# Patient Record
Sex: Female | Born: 1985 | Race: White | Hispanic: No | Marital: Married | Smoking: Current every day smoker
Health system: Southern US, Community
[De-identification: ages and names within clinical notes are randomized; demographics above are authoritative.]

## PROBLEM LIST (undated history)

## (undated) DIAGNOSIS — F329 Major depressive disorder, single episode, unspecified: Secondary | ICD-10-CM

## (undated) DIAGNOSIS — R569 Unspecified convulsions: Secondary | ICD-10-CM

## (undated) DIAGNOSIS — F32A Depression, unspecified: Secondary | ICD-10-CM

## (undated) DIAGNOSIS — F419 Anxiety disorder, unspecified: Secondary | ICD-10-CM

## (undated) DIAGNOSIS — F1910 Other psychoactive substance abuse, uncomplicated: Secondary | ICD-10-CM

## (undated) HISTORY — DX: Other psychoactive substance abuse, uncomplicated: F19.10

---

## 2015-01-14 ENCOUNTER — Emergency Department (HOSPITAL_COMMUNITY)
Admission: EM | Admit: 2015-01-14 | Discharge: 2015-01-14 | Disposition: A | Discharge status: Home / Self Care | Payer: 59 | Attending: Emergency Medicine | Admitting: Emergency Medicine

## 2015-01-14 ENCOUNTER — Emergency Department (HOSPITAL_COMMUNITY): Payer: 59

## 2015-01-14 ENCOUNTER — Encounter (HOSPITAL_COMMUNITY): Payer: Self-pay | Admitting: Emergency Medicine

## 2015-01-14 DIAGNOSIS — S00431A Contusion of right ear, initial encounter: Secondary | ICD-10-CM | POA: Diagnosis not present

## 2015-01-14 DIAGNOSIS — S0990XA Unspecified injury of head, initial encounter: Secondary | ICD-10-CM | POA: Diagnosis present

## 2015-01-14 DIAGNOSIS — S060X0A Concussion without loss of consciousness, initial encounter: Secondary | ICD-10-CM | POA: Insufficient documentation

## 2015-01-14 DIAGNOSIS — Z8669 Personal history of other diseases of the nervous system and sense organs: Secondary | ICD-10-CM | POA: Insufficient documentation

## 2015-01-14 DIAGNOSIS — Z3202 Encounter for pregnancy test, result negative: Secondary | ICD-10-CM | POA: Diagnosis not present

## 2015-01-14 DIAGNOSIS — Y999 Unspecified external cause status: Secondary | ICD-10-CM | POA: Diagnosis not present

## 2015-01-14 DIAGNOSIS — W19XXXA Unspecified fall, initial encounter: Secondary | ICD-10-CM | POA: Insufficient documentation

## 2015-01-14 DIAGNOSIS — Z720 Tobacco use: Secondary | ICD-10-CM | POA: Diagnosis not present

## 2015-01-14 DIAGNOSIS — Y929 Unspecified place or not applicable: Secondary | ICD-10-CM | POA: Insufficient documentation

## 2015-01-14 DIAGNOSIS — Y9389 Activity, other specified: Secondary | ICD-10-CM | POA: Insufficient documentation

## 2015-01-14 LAB — URINALYSIS, ROUTINE W REFLEX MICROSCOPIC
BILIRUBIN URINE: NEGATIVE
Glucose, UA: NEGATIVE mg/dL
Ketones, ur: 15 mg/dL — AB
Leukocytes, UA: NEGATIVE
Nitrite: NEGATIVE
PROTEIN: NEGATIVE mg/dL
Specific Gravity, Urine: 1.025 (ref 1.005–1.030)
UROBILINOGEN UA: 0.2 mg/dL (ref 0.0–1.0)
pH: 5 (ref 5.0–8.0)

## 2015-01-14 LAB — URINE MICROSCOPIC-ADD ON

## 2015-01-14 LAB — PREGNANCY, URINE: PREG TEST UR: NEGATIVE

## 2015-01-14 MED ORDER — KETOROLAC TROMETHAMINE 60 MG/2ML IM SOLN
60.0000 mg | Freq: Once | INTRAMUSCULAR | Status: AC
  Administered 2015-01-14: 60 mg via INTRAMUSCULAR
  Filled 2015-01-14: qty 2

## 2015-01-14 NOTE — ED Provider Notes (Signed)
CSN: [id number]     Arrival date & time 01/14/15  0820 History   First MD Initiated Contact with Patient 01/14/15 0827     Chief Complaint  Patient presents with  . Fall  . Loss of Consciousness  . Tinnitus     (Consider location/radiation/quality/duration/timing/severity/associated sxs/prior Treatment) HPI   Tara Hensley is a 29 y.o. female who presents for injury from fall, 2 days ago. She does not recall the fall, she had been playing with her dogs. Her next memory is being in the bathroom. Since then she's been using ibuprofen without relief of the pain. She has headache, dizziness, decreased hearing, right ear, and photophobia. She has chronic photophobia from "glaucoma". No prior head injuries. There are no other no modifying factors.   History reviewed. No pertinent past medical history. History reviewed. No pertinent past surgical history. History reviewed. No pertinent family history. Social History  Substance Use Topics  . Smoking status: Current Every Day Smoker -- 25.00 packs/day    Types: Cigarettes  . Smokeless tobacco: None  . Alcohol Use: Yes     Comment: occasionally   OB History    No data available     Review of Systems  All other systems reviewed and are negative.     Allergies  Claritin  Home Medications   Prior to Admission medications   Medication Sig Start Date End Date Taking? Authorizing Provider  ibuprofen (ADVIL,MOTRIN) 400 MG tablet Take 400 mg by mouth every 6 (six) hours as needed for mild pain.   Yes Historical Provider, MD   BP 124/81 mmHg  Pulse 71  Temp(Src) 98.4 F (36.9 C) (Oral)  Resp 16  SpO2 98%  LMP 01/12/2015 (Exact Date) Physical Exam  Constitutional: She is oriented to person, place, and time. She appears well-developed and well-nourished. She appears distressed (She is uncomfortable.).  HENT:  Head: Normocephalic and atraumatic.  Right Ear: External ear normal.  Left Ear: External ear normal.  Decreased  hearing, right-sided. No blood in right auditory canal, normal TM, right. Bruising posterior to right ear, without crepitation or deformity.  Eyes: Conjunctivae and EOM are normal. Pupils are equal, round, and reactive to light.  Neck: Normal range of motion and phonation normal. Neck supple.  Cardiovascular: Normal rate, regular rhythm and normal heart sounds.   Pulmonary/Chest: Effort normal and breath sounds normal. She exhibits no bony tenderness.  Abdominal: Soft. There is no tenderness.  Musculoskeletal: Normal range of motion.  No tenderness or altered motion of cervical, thoracic, lumbar spine.  Neurological: She is alert and oriented to person, place, and time. No cranial nerve deficit or sensory deficit. She exhibits normal muscle tone. Coordination normal.  Skin: Skin is warm, dry and intact.  Psychiatric: She has a normal mood and affect. Her behavior is normal. Judgment and thought content normal.  Nursing note and vitals reviewed.   ED Course  Procedures (including critical care time)  Medications  ketorolac (TORADOL) injection 60 mg (60 mg Intramuscular Given 01/14/15 1032)    Patient Vitals for the past 24 hrs:  BP Temp Temp src Pulse Resp SpO2  01/14/15 1145 124/81 mmHg - - 71 16 98 %  01/14/15 1130 126/81 mmHg - - 76 20 99 %  01/14/15 1115 120/69 mmHg - - 76 20 98 %  01/14/15 1101 121/79 mmHg 98.4 F (36.9 C) Oral 71 19 98 %  01/14/15 1100 121/79 mmHg - - 73 15 99 %  01/14/15 0915 121/82 mmHg - - 68  13 98 %  01/14/15 0900 118/81 mmHg - - 82 14 96 %  01/14/15 0845 117/84 mmHg - - 76 14 97 %  01/14/15 0840 122/77 mmHg 98.2 F (36.8 C) Oral 75 14 97 %    At D/C Reevaluation with update and discussion. After initial assessment and treatment, an updated evaluation reveals no change in clinical status. Findings discussed with patient and partner. All questions answered. Yavier Snider L    Labs Review Labs Reviewed  URINALYSIS, ROUTINE W REFLEX MICROSCOPIC (NOT AT  [facility]) - Abnormal; Notable for the following:    APPearance CLOUDY (*)    Hgb urine dipstick SMALL (*)    Ketones, ur 15 (*)    All other components within normal limits  URINE MICROSCOPIC-ADD ON - Abnormal; Notable for the following:    Squamous Epithelial / LPF MANY (*)    Bacteria, UA FEW (*)    All other components within normal limits  PREGNANCY, URINE    Imaging Review Ct Head Wo Contrast  01/14/2015  CLINICAL DATA:  Pain following fall.  Dizziness. EXAM: CT HEAD WITHOUT CONTRAST TECHNIQUE: Contiguous axial images were obtained from the base of the skull through the vertex without intravenous contrast. COMPARISON:  None. FINDINGS: The ventricles are normal in size and configuration. There is no intracranial mass, hemorrhage, extra-axial fluid collection, or midline shift. The gray-white compartments are normal. No acute infarct evident. The bony calvarium appears intact. The mastoid air cells are clear. There is leftward deviation the nasal septum. IMPRESSION: Incidental note is made of leftward deviation of the nasal septum. Study otherwise unremarkable. No intracranial mass, hemorrhage, or extra-axial fluid collection. Gray-white compartments appear normal. Electronically Signed   By: Bretta Bang III M.D.   On: 01/14/2015 10:00   I have personally reviewed and evaluated these images and lab results as part of my medical decision-making.   EKG Interpretation None      MDM   Final diagnoses:  Concussion, without loss of consciousness, initial encounter  Head injury, initial encounter    Syncope and fall without injury. Likely vaso-vagal. Doubt SBI, metabolic instability or impending vasclar collapse.  Nursing Notes Reviewed/ Care Coordinated Applicable Imaging Reviewed Interpretation of Laboratory Data incorporated into ED treatment  The patient appears reasonably screened and/or stabilized for discharge and I doubt any other medical condition or other [facility] requiring  further screening, evaluation, or treatment in the ED at this time prior to discharge.  Plan: Home Medications- usual; Home Treatments- rest; return here if the recommended treatment, does not improve the symptoms; Recommended follow up- PCP prn    Mancel Bale, MD 01/14/15 1916

## 2015-01-14 NOTE — ED Notes (Signed)
Pt to ER via POV by husband, pt experienced unwitnessed fall Wednesday night. Pt does not remember fall but presents with bruising behind right ear, complaining of ringing in ears and difficulty hearing as well as dizziness and blurred vision. Pt is a/o x4. Speaking in clear, full sentences. No blood thinner use. Denies any medical hx. VSS.

## 2015-01-14 NOTE — Discharge Instructions (Signed)
Concussion, Adult A concussion, or closed-head injury, is a brain injury caused by a direct blow to the head or by a quick and sudden movement (jolt) of the head or neck. Concussions are usually not life-threatening. Even so, the effects of a concussion can be serious. If you have had a concussion before, you are more likely to experience concussion-like symptoms after a direct blow to the head.  CAUSES  Direct blow to the head, such as from running into another player during a soccer game, being hit in a fight, or hitting your head on a hard surface.  A jolt of the head or neck that causes the brain to move back and forth inside the skull, such as in a car crash. SIGNS AND SYMPTOMS The signs of a concussion can be hard to notice. Early on, they may be missed by you, family members, and health care providers. You may look fine but act or feel differently. Symptoms are usually temporary, but they may last for days, weeks, or even longer. Some symptoms may appear right away while others may not show up for hours or days. Every head injury is different. Symptoms include:  Mild to moderate headaches that will not go away.  A feeling of pressure inside your head.  Having more trouble than usual:  Learning or remembering things you have heard.  Answering questions.  Paying attention or concentrating.  Organizing daily tasks.  Making decisions and solving problems.  Slowness in thinking, acting or reacting, speaking, or reading.  Getting lost or being easily confused.  Feeling tired all the time or lacking energy (fatigued).  Feeling drowsy.  Sleep disturbances.  Sleeping more than usual.  Sleeping less than usual.  Trouble falling asleep.  Trouble sleeping (insomnia).  Loss of balance or feeling lightheaded or dizzy.  Nausea or vomiting.  Numbness or tingling.  Increased sensitivity to:  Sounds.  Lights.  Distractions.  Vision problems or eyes that tire  easily.  Diminished sense of taste or smell.  Ringing in the ears.  Mood changes such as feeling sad or anxious.  Becoming easily irritated or angry for little or no reason.  Lack of motivation.  Seeing or hearing things other people do not see or hear (hallucinations). DIAGNOSIS Your health care provider can usually diagnose a concussion based on a description of your injury and symptoms. He or she will ask whether you passed out (lost consciousness) and whether you are having trouble remembering events that happened right before and during your injury. Your evaluation might include:  A brain scan to look for signs of injury to the brain. Even if the test shows no injury, you may still have a concussion.  Blood tests to be sure other problems are not present. TREATMENT  Concussions are usually treated in an emergency department, in urgent care, or at a clinic. You may need to stay in the hospital overnight for further treatment.  Tell your health care provider if you are taking any medicines, including prescription medicines, over-the-counter medicines, and natural remedies. Some medicines, such as blood thinners (anticoagulants) and aspirin, may increase the chance of complications. Also tell your health care provider whether you have had alcohol or are taking illegal drugs. This information may affect treatment.  Your health care provider will send you home with important instructions to follow.  How fast you will recover from a concussion depends on many factors. These factors include how severe your concussion is, what part of your brain was injured,  your age, and how healthy you were before the concussion. °· Most people with mild injuries recover fully. Recovery can take time. In general, recovery is slower in older persons. Also, persons who have had a concussion in the past or have other medical problems may find that it takes longer to recover from their current injury. °HOME  CARE INSTRUCTIONS °General Instructions °· Carefully follow the directions your health care provider gave you. °· Only take over-the-counter or prescription medicines for pain, discomfort, or fever as directed by your health care provider. °· Take only those medicines that your health care provider has approved. °· Do not drink alcohol until your health care provider says you are well enough to do so. Alcohol and certain other drugs may slow your recovery and can put you at risk of further injury. °· If it is harder than usual to remember things, write them down. °· If you are easily distracted, try to do one thing at a time. For example, do not try to watch TV while fixing dinner. °· Talk with family members or close friends when making important decisions. °· Keep all follow-up appointments. Repeated evaluation of your symptoms is recommended for your recovery. °· Watch your symptoms and tell others to do the same. Complications sometimes occur after a concussion. Older adults with a brain injury may have a higher risk of serious complications, such as a blood clot on the brain. °· Tell your teachers, school nurse, school counselor, coach, athletic trainer, or work manager about your injury, symptoms, and restrictions. Tell them about what you can or cannot do. They should watch for: °¨ Increased problems with attention or concentration. °¨ Increased difficulty remembering or learning new information. °¨ Increased time needed to complete tasks or assignments. °¨ Increased irritability or decreased ability to cope with stress. °¨ Increased symptoms. °· Rest. Rest helps the brain to heal. Make sure you: °¨ Get plenty of sleep at night. Avoid staying up late at night. °¨ Keep the same bedtime hours on weekends and weekdays. °¨ Rest during the day. Take daytime naps or rest breaks when you feel tired. °· Limit activities that require a lot of thought or concentration. These include: °¨ Doing homework or job-related  work. °¨ Watching TV. °¨ Working on the computer. °· Avoid any situation where there is potential for another head injury (football, hockey, soccer, basketball, martial arts, downhill snow sports and horseback riding). Your condition will get worse every time you experience a concussion. You should avoid these activities until you are evaluated by the appropriate follow-up health care providers. °Returning To Your Regular Activities °You will need to return to your normal activities slowly, not all at once. You must give your body and brain enough time for recovery. °· Do not return to sports or other athletic activities until your health care provider tells you it is safe to do so. °· Ask your health care provider when you can drive, ride a bicycle, or operate heavy machinery. Your ability to react may be slower after a brain injury. Never do these activities if you are dizzy. °· Ask your health care provider about when you can return to work or school. °Preventing Another Concussion °It is very important to avoid another brain injury, especially before you have recovered. In rare cases, another injury can lead to permanent brain damage, brain swelling, or death. The risk of this is greatest during the first 7-10 days after a head injury. Avoid injuries by: °· Wearing a   your health care provider when you can drive, ride a bicycle, or operate heavy machinery. Your ability to react may be slower after a brain injury. Never do these activities if you are dizzy.   Ask your health care provider about when you can return to work or school.  Preventing Another Concussion  It is very important to avoid another brain injury, especially before you have recovered. In rare cases, another injury can lead to permanent brain damage, brain swelling, or death. The risk of this is greatest during the first 7-10 days after a head injury. Avoid injuries by:   Wearing a seat belt when riding in a car.   Drinking alcohol only in moderation.   Wearing a helmet when biking, skiing, skateboarding, skating, or doing similar activities.   Avoiding activities that could lead to a second concussion, such as contact or recreational sports, until your health care provider says it is okay.   Taking safety measures in your home.    Remove clutter and tripping hazards from floors and stairways.    Use grab bars in bathrooms and handrails by stairs.    Place non-slip mats on floors and in bathtubs.    Improve lighting in dim areas.  SEEK MEDICAL CARE IF:   You have increased problems paying attention or  concentrating.   You have increased difficulty remembering or learning new information.   You need more time to complete tasks or assignments than before.   You have increased irritability or decreased ability to cope with stress.   You have more symptoms than before.  Seek medical care if you have any of the following symptoms for more than 2 weeks after your injury:   Lasting (chronic) headaches.   Dizziness or balance problems.   Nausea.   Vision problems.   Increased sensitivity to noise or light.   Depression or mood swings.   Anxiety or irritability.   Memory problems.   Difficulty concentrating or paying attention.   Sleep problems.   Feeling tired all the time.  SEEK IMMEDIATE MEDICAL CARE IF:   You have severe or worsening headaches. These may be a sign of a blood clot in the brain.   You have weakness (even if only in one hand, leg, or part of the face).   You have numbness.   You have decreased coordination.   You vomit repeatedly.   You have increased sleepiness.   One pupil is larger than the other.   You have convulsions.   You have slurred speech.   You have increased confusion. This may be a sign of a blood clot in the brain.   You have increased restlessness, agitation, or irritability.   You are unable to recognize people or places.   You have neck pain.   It is difficult to wake you up.   You have unusual behavior changes.   You lose consciousness.  MAKE SURE YOU:   Understand these instructions.   Will watch your condition.   Will get help right away if you are not doing well or get worse.     This information is not intended to replace advice given to you by your health care provider. Make sure you discuss any questions you have with your health care provider.     Document Released: 05/12/2003 Document Revised: 03/12/2014 Document Reviewed: 09/11/2012  Elsevier Interactive Patient Education 2016 [employer]  Head Injury, Adult  You have received a head  injury. It does not   in the brain that collects, clots, and forms a bump (hematoma). WHAT CAUSES A HEAD INJURY? A serious head injury is most likely to happen to someone who is in a car wreck and is not wearing a seat belt. Other causes of major head injuries include bicycle or motorcycle accidents, sports injuries, and falls. HOW ARE HEAD INJURIES DIAGNOSED? A complete history of the event leading to the injury and your current symptoms will be helpful in diagnosing head injuries. Many times, pictures of the brain, such as CT or MRI are needed to see the extent of the injury. Often, an overnight hospital stay is necessary for observation.  WHEN SHOULD I SEEK IMMEDIATE MEDICAL CARE?  You should get help right away if:  You have confusion or drowsiness.  You feel sick to your stomach (nauseous) or have continued, forceful vomiting.  You have dizziness or unsteadiness that is getting worse.  You have severe, continued headaches not relieved by medicine. Only take over-the-counter or  prescription medicines for pain, fever, or discomfort as directed by your health care provider.  You do not have normal function of the arms or legs or are unable to walk.  You notice changes in the black spots in the center of the colored part of your eye (pupil).  You have a clear or bloody fluid coming from your nose or ears.  You have a loss of vision. During the next 24 hours after the injury, you must stay with someone who can watch you for the warning signs. This person should contact local emergency services (911 in the [place] if you have seizures, you become unconscious, or you are unable to wake up. HOW CAN I PREVENT A HEAD INJURY IN THE FUTURE? The most important factor for preventing major head injuries is avoiding motor vehicle accidents. To minimize the potential for damage to your head, it is crucial to wear seat belts while riding in motor vehicles. Wearing helmets while bike riding and playing collision sports (like football) is also helpful. Also, avoiding dangerous activities around the house will further help reduce your risk of head injury.  WHEN CAN I RETURN TO NORMAL ACTIVITIES AND ATHLETICS? You should be reevaluated by your health care provider before returning to these activities. If you have any of the following symptoms, you should not return to activities or contact sports until 1 week after the symptoms have stopped:  Persistent headache.  Dizziness or vertigo.  Poor attention and concentration.  Confusion.  Memory problems.  Nausea or vomiting.  Fatigue or tire easily.  Irritability.  Intolerant of bright lights or loud noises.  Anxiety or depression.  Disturbed sleep. MAKE SURE YOU:   Understand these instructions.  Will watch your condition.  Will get help right away if you are not doing well or get worse.   This information is not intended to replace advice given to you by your health care provider. Make sure you discuss any questions you  have with your health care provider.   Document Released: 02/19/2005 Document Revised: 03/12/2014 Document Reviewed: 10/27/2012 Elsevier Interactive Patient Education 2016 [employer].  Concussion, Adult A concussion, or closed-head injury, is a brain injury caused by a direct blow to the head or by a quick and sudden movement (jolt) of the head or neck. Concussions are usually not life-threatening. Even so, the effects of a concussion can be serious. If you have had a concussion before, you are more likely to experience concussion-like symptoms after a direct blow to  the head.  CAUSES  Direct blow to the head, such as from running into another player during a soccer game, being hit in a fight, or hitting your head on a hard surface.  A jolt of the head or neck that causes the brain to move back and forth inside the skull, such as in a car crash. SIGNS AND SYMPTOMS The signs of a concussion can be hard to notice. Early on, they may be missed by you, family members, and health care providers. You may look fine but act or feel differently. Symptoms are usually temporary, but they may last for days, weeks, or even longer. Some symptoms may appear right away while others may not show up for hours or days. Every head injury is different. Symptoms include:  Mild to moderate headaches that will not go away.  A feeling of pressure inside your head.  Having more trouble than usual:  Learning or remembering things you have heard.  Answering questions.  Paying attention or concentrating.  Organizing daily tasks.  Making decisions and solving problems.  Slowness in thinking, acting or reacting, speaking, or reading.  Getting lost or being easily confused.  Feeling tired all the time or lacking energy (fatigued).  Feeling drowsy.  Sleep disturbances.  Sleeping more than usual.  Sleeping less than usual.  Trouble falling asleep.  Trouble sleeping (insomnia).  Loss of balance or  feeling lightheaded or dizzy.  Nausea or vomiting.  Numbness or tingling.  Increased sensitivity to:  Sounds.  Lights.  Distractions.  Vision problems or eyes that tire easily.  Diminished sense of taste or smell.  Ringing in the ears.  Mood changes such as feeling sad or anxious.  Becoming easily irritated or angry for little or no reason.  Lack of motivation.  Seeing or hearing things other people do not see or hear (hallucinations). DIAGNOSIS Your health care provider can usually diagnose a concussion based on a description of your injury and symptoms. He or she will ask whether you passed out (lost consciousness) and whether you are having trouble remembering events that happened right before and during your injury. Your evaluation might include:  A brain scan to look for signs of injury to the brain. Even if the test shows no injury, you may still have a concussion.  Blood tests to be sure other problems are not present. TREATMENT  Concussions are usually treated in an emergency department, in urgent care, or at a clinic. You may need to stay in the hospital overnight for further treatment.  Tell your health care provider if you are taking any medicines, including prescription medicines, over-the-counter medicines, and natural remedies. Some medicines, such as blood thinners (anticoagulants) and aspirin, may increase the chance of complications. Also tell your health care provider whether you have had alcohol or are taking illegal drugs. This information may affect treatment.  Your health care provider will send you home with important instructions to follow.  How fast you will recover from a concussion depends on many factors. These factors include how severe your concussion is, what part of your brain was injured, your age, and how healthy you were before the concussion.  Most people with mild injuries recover fully. Recovery can take time. In general, recovery is  slower in older persons. Also, persons who have had a concussion in the past or have other medical problems may find that it takes longer to recover from their current injury. HOME CARE INSTRUCTIONS General Instructions  Carefully follow the  directions your health care provider gave you.  Only take over-the-counter or prescription medicines for pain, discomfort, or fever as directed by your health care provider.  Take only those medicines that your health care provider has approved.  Do not drink alcohol until your health care provider says you are well enough to do so. Alcohol and certain other drugs may slow your recovery and can put you at risk of further injury.  If it is harder than usual to remember things, write them down.  If you are easily distracted, try to do one thing at a time. For example, do not try to watch TV while fixing dinner.  Talk with family members or close friends when making important decisions.  Keep all follow-up appointments. Repeated evaluation of your symptoms is recommended for your recovery.  Watch your symptoms and tell others to do the same. Complications sometimes occur after a concussion. Older adults with a brain injury may have a higher risk of serious complications, such as a blood clot on the brain.  Tell your teachers, school nurse, school counselor, coach, athletic trainer, or work Production designer, theatre/television/film about your injury, symptoms, and restrictions. Tell them about what you can or cannot do. They should watch for:  Increased problems with attention or concentration.  Increased difficulty remembering or learning new information.  Increased time needed to complete tasks or assignments.  Increased irritability or decreased ability to cope with stress.  Increased symptoms.  Rest. Rest helps the brain to heal. Make sure you:  Get plenty of sleep at night. Avoid staying up late at night.  Keep the same bedtime hours on weekends and weekdays.  Rest during  the day. Take daytime naps or rest breaks when you feel tired.  Limit activities that require a lot of thought or concentration. These include:  Doing homework or job-related work.  Watching TV.  Working on the computer.  Avoid any situation where there is potential for another head injury (football, hockey, soccer, basketball, martial arts, downhill snow sports and horseback riding). Your condition will get worse every time you experience a concussion. You should avoid these activities until you are evaluated by the appropriate follow-up health care providers. Returning To Your Regular Activities You will need to return to your normal activities slowly, not all at once. You must give your body and brain enough time for recovery.  Do not return to sports or other athletic activities until your health care provider tells you it is safe to do so.  Ask your health care provider when you can drive, ride a bicycle, or operate heavy machinery. Your ability to react may be slower after a brain injury. Never do these activities if you are dizzy.  Ask your health care provider about when you can return to work or school. Preventing Another Concussion It is very important to avoid another brain injury, especially before you have recovered. In rare cases, another injury can lead to permanent brain damage, brain swelling, or death. The risk of this is greatest during the first 7-10 days after a head injury. Avoid injuries by:  Wearing a seat belt when riding in a car.  Drinking alcohol only in moderation.  Wearing a helmet when biking, skiing, skateboarding, skating, or doing similar activities.  Avoiding activities that could lead to a second concussion, such as contact or recreational sports, until your health care provider says it is okay.  Taking safety measures in your home.  Remove clutter and tripping hazards from floors and  stairways.  Use grab bars in bathrooms and handrails by  stairs.  Place non-slip mats on floors and in bathtubs.  Improve lighting in dim areas. SEEK MEDICAL CARE IF:  You have increased problems paying attention or concentrating.  You have increased difficulty remembering or learning new information.  You need more time to complete tasks or assignments than before.  You have increased irritability or decreased ability to cope with stress.  You have more symptoms than before. Seek medical care if you have any of the following symptoms for more than 2 weeks after your injury:  Lasting (chronic) headaches.  Dizziness or balance problems.  Nausea.  Vision problems.  Increased sensitivity to noise or light.  Depression or mood swings.  Anxiety or irritability.  Memory problems.  Difficulty concentrating or paying attention.  Sleep problems.  Feeling tired all the time. SEEK IMMEDIATE MEDICAL CARE IF:  You have severe or worsening headaches. These may be a sign of a blood clot in the brain.  You have weakness (even if only in one hand, leg, or part of the face).  You have numbness.  You have decreased coordination.  You vomit repeatedly.  You have increased sleepiness.  One pupil is larger than the other.  You have convulsions.  You have slurred speech.  You have increased confusion. This may be a sign of a blood clot in the brain.  You have increased restlessness, agitation, or irritability.  You are unable to recognize people or places.  You have neck pain.  It is difficult to wake you up.  You have unusual behavior changes.  You lose consciousness. MAKE SURE YOU:  Understand these instructions.  Will watch your condition.  Will get help right away if you are not doing well or get worse.   This information is not intended to replace advice given to you by your health care provider. Make sure you discuss any questions you have with your health care provider.   Document Released: 05/12/2003  Document Revised: 03/12/2014 Document Reviewed: 09/11/2012 Elsevier Interactive Patient Education 2016 [employer]  Head Injury, Adult You have received a head injury. It does not appear serious at this time. Headaches and vomiting are common following head injury. It should be easy to awaken from sleeping. Sometimes it is necessary for you to stay in the emergency department for a while for observation. Sometimes admission to the hospital may be needed. After injuries such as yours, most problems occur within the first 24 hours, but side effects may occur up to 7-10 days after the injury. It is important for you to carefully monitor your condition and contact your health care provider or seek immediate medical care if there is a change in your condition. WHAT ARE THE TYPES OF HEAD INJURIES? Head injuries can be as minor as a bump. Some head injuries can be more severe. More severe head injuries include:  A jarring injury to the brain (concussion).  A bruise of the brain (contusion). This mean there is bleeding in the brain that can cause swelling.  A cracked skull (skull fracture).  Bleeding in the brain that collects, clots, and forms a bump (hematoma). WHAT CAUSES A HEAD INJURY? A serious head injury is most likely to happen to someone who is in a car wreck and is not wearing a seat belt. Other causes of major head injuries include bicycle or motorcycle accidents, sports injuries, and falls. HOW ARE HEAD INJURIES DIAGNOSED? A complete history of the event leading  to the injury and your current symptoms will be helpful in diagnosing head injuries. Many times, pictures of the brain, such as CT or MRI are needed to see the extent of the injury. Often, an overnight hospital stay is necessary for observation.  WHEN SHOULD I SEEK IMMEDIATE MEDICAL CARE?  You should get help right away if:  You have confusion or drowsiness.  You feel sick to your stomach (nauseous) or have continued, forceful  vomiting.  You have dizziness or unsteadiness that is getting worse.  You have severe, continued headaches not relieved by medicine. Only take over-the-counter or prescription medicines for pain, fever, or discomfort as directed by your health care provider.  You do not have normal function of the arms or legs or are unable to walk.  You notice changes in the black spots in the center of the colored part of your eye (pupil).  You have a clear or bloody fluid coming from your nose or ears.  You have a loss of vision. During the next 24 hours after the injury, you must stay with someone who can watch you for the warning signs. This person should contact local emergency services (911 in the [place] if you have seizures, you become unconscious, or you are unable to wake up. HOW CAN I PREVENT A HEAD INJURY IN THE FUTURE? The most important factor for preventing major head injuries is avoiding motor vehicle accidents. To minimize the potential for damage to your head, it is crucial to wear seat belts while riding in motor vehicles. Wearing helmets while bike riding and playing collision sports (like football) is also helpful. Also, avoiding dangerous activities around the house will further help reduce your risk of head injury.  WHEN CAN I RETURN TO NORMAL ACTIVITIES AND ATHLETICS? You should be reevaluated by your health care provider before returning to these activities. If you have any of the following symptoms, you should not return to activities or contact sports until 1 week after the symptoms have stopped:  Persistent headache.  Dizziness or vertigo.  Poor attention and concentration.  Confusion.  Memory problems.  Nausea or vomiting.  Fatigue or tire easily.  Irritability.  Intolerant of bright lights or loud noises.  Anxiety or depression.  Disturbed sleep. MAKE SURE YOU:   Understand these instructions.  Will watch your condition.  Will get help right away if you are  not doing well or get worse.   This information is not intended to replace advice given to you by your health care provider. Make sure you discuss any questions you have with your health care provider.   Document Released: 02/19/2005 Document Revised: 03/12/2014 Document Reviewed: 10/27/2012 Elsevier Interactive Patient Education [employer]

## 2015-01-14 NOTE — ED Notes (Signed)
Pt verbalizes understanding of discharge instructions and follow up information. NAD on departure. VSS. A/O x4.

## 2015-04-25 ENCOUNTER — Inpatient Hospital Stay
Admission: EM | Admit: 2015-04-25 | Discharge: 2015-04-26 | DRG: 101 | Disposition: A | Discharge status: Home / Self Care | Payer: 59 | Attending: Internal Medicine | Admitting: Internal Medicine

## 2015-04-25 ENCOUNTER — Inpatient Hospital Stay: Payer: 59

## 2015-04-25 ENCOUNTER — Encounter: Payer: Self-pay | Admitting: Medical Oncology

## 2015-04-25 DIAGNOSIS — Z8249 Family history of ischemic heart disease and other diseases of the circulatory system: Secondary | ICD-10-CM | POA: Diagnosis not present

## 2015-04-25 DIAGNOSIS — F1994 Other psychoactive substance use, unspecified with psychoactive substance-induced mood disorder: Secondary | ICD-10-CM

## 2015-04-25 DIAGNOSIS — F10939 Alcohol use, unspecified with withdrawal, unspecified: Secondary | ICD-10-CM

## 2015-04-25 DIAGNOSIS — S51812A Laceration without foreign body of left forearm, initial encounter: Secondary | ICD-10-CM | POA: Diagnosis present

## 2015-04-25 DIAGNOSIS — R569 Unspecified convulsions: Secondary | ICD-10-CM | POA: Diagnosis present

## 2015-04-25 DIAGNOSIS — F10239 Alcohol dependence with withdrawal, unspecified: Secondary | ICD-10-CM | POA: Diagnosis present

## 2015-04-25 DIAGNOSIS — IMO0002 Reserved for concepts with insufficient information to code with codable children: Secondary | ICD-10-CM

## 2015-04-25 DIAGNOSIS — F10929 Alcohol use, unspecified with intoxication, unspecified: Secondary | ICD-10-CM

## 2015-04-25 DIAGNOSIS — F419 Anxiety disorder, unspecified: Secondary | ICD-10-CM | POA: Diagnosis present

## 2015-04-25 DIAGNOSIS — Z79899 Other long term (current) drug therapy: Secondary | ICD-10-CM | POA: Diagnosis not present

## 2015-04-25 DIAGNOSIS — G4089 Other seizures: Secondary | ICD-10-CM | POA: Diagnosis not present

## 2015-04-25 DIAGNOSIS — Z818 Family history of other mental and behavioral disorders: Secondary | ICD-10-CM

## 2015-04-25 DIAGNOSIS — Z7289 Other problems related to lifestyle: Secondary | ICD-10-CM

## 2015-04-25 DIAGNOSIS — Z8659 Personal history of other mental and behavioral disorders: Secondary | ICD-10-CM | POA: Diagnosis not present

## 2015-04-25 DIAGNOSIS — S61512A Laceration without foreign body of left wrist, initial encounter: Secondary | ICD-10-CM | POA: Diagnosis present

## 2015-04-25 DIAGNOSIS — X789XXA Intentional self-harm by unspecified sharp object, initial encounter: Secondary | ICD-10-CM | POA: Diagnosis present

## 2015-04-25 DIAGNOSIS — F1010 Alcohol abuse, uncomplicated: Secondary | ICD-10-CM

## 2015-04-25 DIAGNOSIS — F17210 Nicotine dependence, cigarettes, uncomplicated: Secondary | ICD-10-CM | POA: Diagnosis present

## 2015-04-25 DIAGNOSIS — F329 Major depressive disorder, single episode, unspecified: Secondary | ICD-10-CM | POA: Diagnosis present

## 2015-04-25 HISTORY — DX: Unspecified convulsions: R56.9

## 2015-04-25 HISTORY — DX: Anxiety disorder, unspecified: F41.9

## 2015-04-25 HISTORY — DX: Depression, unspecified: F32.A

## 2015-04-25 HISTORY — DX: Major depressive disorder, single episode, unspecified: F32.9

## 2015-04-25 LAB — GLUCOSE, CAPILLARY: Glucose-Capillary: 75 mg/dL (ref 65–99)

## 2015-04-25 LAB — URINALYSIS COMPLETE WITH MICROSCOPIC (ARMC ONLY)
BACTERIA UA: NONE SEEN
Bilirubin Urine: NEGATIVE
GLUCOSE, UA: NEGATIVE mg/dL
LEUKOCYTES UA: NEGATIVE
Nitrite: NEGATIVE
PH: 5 (ref 5.0–8.0)
PROTEIN: NEGATIVE mg/dL
SPECIFIC GRAVITY, URINE: 1.019 (ref 1.005–1.030)

## 2015-04-25 LAB — COMPREHENSIVE METABOLIC PANEL
ALBUMIN: 4.6 g/dL (ref 3.5–5.0)
ALT: 21 U/L (ref 14–54)
ANION GAP: 12 (ref 5–15)
AST: 48 U/L — AB (ref 15–41)
Alkaline Phosphatase: 89 U/L (ref 38–126)
BUN: 15 mg/dL (ref 6–20)
CHLORIDE: 103 mmol/L (ref 101–111)
CO2: 25 mmol/L (ref 22–32)
Calcium: 8.6 mg/dL — ABNORMAL LOW (ref 8.9–10.3)
Creatinine, Ser: 0.75 mg/dL (ref 0.44–1.00)
GFR calc Af Amer: >60 mL/min (ref >60)
GFR calc non Af Amer: >60 mL/min (ref >60)
GLUCOSE: 82 mg/dL (ref 65–99)
POTASSIUM: 3.7 mmol/L (ref 3.5–5.1)
SODIUM: 140 mmol/L (ref 135–145)
Total Bilirubin: 0.7 mg/dL (ref 0.3–1.2)
Total Protein: 8.5 g/dL — ABNORMAL HIGH (ref 6.5–8.1)

## 2015-04-25 LAB — URINE DRUG SCREEN, QUALITATIVE (ARMC ONLY)
Amphetamines, Ur Screen: NOT DETECTED
BARBITURATES, UR SCREEN: NOT DETECTED
Benzodiazepine, Ur Scrn: POSITIVE — AB
CANNABINOID 50 NG, UR ~~LOC~~: NOT DETECTED
COCAINE METABOLITE, UR ~~LOC~~: NOT DETECTED
MDMA (Ecstasy)Ur Screen: NOT DETECTED
Methadone Scn, Ur: NOT DETECTED
Opiate, Ur Screen: NOT DETECTED
Phencyclidine (PCP) Ur S: NOT DETECTED
TRICYCLIC, UR SCREEN: NOT DETECTED

## 2015-04-25 LAB — CBC
HCT: 48.8 % — ABNORMAL HIGH (ref 35.0–47.0)
Hemoglobin: 16.8 g/dL — ABNORMAL HIGH (ref 12.0–16.0)
MCH: 34.7 pg — ABNORMAL HIGH (ref 26.0–34.0)
MCHC: 34.4 g/dL (ref 32.0–36.0)
MCV: 101 fL — ABNORMAL HIGH (ref 80.0–100.0)
Platelets: 392 10*3/uL (ref 150–440)
RBC: 4.83 MIL/uL (ref 3.80–5.20)
RDW: 13.2 % (ref 11.5–14.5)
WBC: 6.2 10*3/uL (ref 3.6–11.0)

## 2015-04-25 LAB — ACETAMINOPHEN LEVEL: ACETAMINOPHEN (TYLENOL), SERUM: 18 ug/mL (ref 10–30)

## 2015-04-25 LAB — SALICYLATE LEVEL: Salicylate Lvl: <4 mg/dL (ref 2.8–30.0)

## 2015-04-25 LAB — MAGNESIUM: MAGNESIUM: 2.1 mg/dL (ref 1.7–2.4)

## 2015-04-25 LAB — ETHANOL: Alcohol, Ethyl (B): 318 mg/dL (ref <5)

## 2015-04-25 LAB — POCT PREGNANCY, URINE: PREG TEST UR: NEGATIVE

## 2015-04-25 LAB — PHOSPHORUS: Phosphorus: 3.5 mg/dL (ref 2.5–4.6)

## 2015-04-25 MED ORDER — ONDANSETRON HCL 4 MG/2ML IJ SOLN: 4.0000 mg | Freq: Four times a day (QID) | INTRAMUSCULAR | Status: DC | PRN

## 2015-04-25 MED ORDER — LORAZEPAM 2 MG/ML IJ SOLN
1.0000 mg | Freq: Once | INTRAMUSCULAR | Status: AC
  Administered 2015-04-25: 1 mg via INTRAVENOUS

## 2015-04-25 MED ORDER — SODIUM CHLORIDE 0.9 % IV SOLN
INTRAVENOUS | Status: DC
  Administered 2015-04-25 – 2015-04-26 (×2): via INTRAVENOUS

## 2015-04-25 MED ORDER — BACITRACIN ZINC 500 UNIT/GM EX OINT
TOPICAL_OINTMENT | Freq: Once | CUTANEOUS | Status: AC
  Administered 2015-04-25: 1 via TOPICAL

## 2015-04-25 MED ORDER — ALPRAZOLAM 0.5 MG PO TABS
0.5000 mg | ORAL_TABLET | Freq: Three times a day (TID) | ORAL | Status: DC | PRN
Start: 2015-04-25 — End: 2015-04-26
  Administered 2015-04-25 (×2): 0.5 mg via ORAL
  Filled 2015-04-25 (×2): qty 1

## 2015-04-25 MED ORDER — SODIUM CHLORIDE 0.9 % IV SOLN
1000.0000 mg | Freq: Once | INTRAVENOUS | Status: AC
  Administered 2015-04-25: 1000 mg via INTRAVENOUS
  Filled 2015-04-25: qty 10

## 2015-04-25 MED ORDER — ADULT MULTIVITAMIN W/MINERALS CH
1.0000 | ORAL_TABLET | Freq: Every day | ORAL | Status: DC
  Administered 2015-04-25 – 2015-04-26 (×2): 1 via ORAL
  Filled 2015-04-25 (×2): qty 1

## 2015-04-25 MED ORDER — LORAZEPAM 2 MG PO TABS
0.0000 mg | ORAL_TABLET | Freq: Four times a day (QID) | ORAL | Status: DC
  Administered 2015-04-25: 1 mg via ORAL
  Administered 2015-04-26: 4 mg via ORAL
  Filled 2015-04-25: qty 2

## 2015-04-25 MED ORDER — FOLIC ACID 1 MG PO TABS
1.0000 mg | ORAL_TABLET | Freq: Every day | ORAL | Status: DC
  Administered 2015-04-25 – 2015-04-26 (×2): 1 mg via ORAL
  Filled 2015-04-25 (×2): qty 1

## 2015-04-25 MED ORDER — LORAZEPAM 2 MG PO TABS: 0.0000 mg | ORAL_TABLET | Freq: Two times a day (BID) | ORAL | Status: DC

## 2015-04-25 MED ORDER — THIAMINE HCL 100 MG/ML IJ SOLN: 100.0000 mg | Freq: Every day | INTRAMUSCULAR | Status: DC

## 2015-04-25 MED ORDER — LORAZEPAM 2 MG/ML IJ SOLN
INTRAMUSCULAR | Status: AC
  Administered 2015-04-25: 1 mg via INTRAVENOUS
  Filled 2015-04-25: qty 1

## 2015-04-25 MED ORDER — SENNOSIDES-DOCUSATE SODIUM 8.6-50 MG PO TABS: 1.0000 | ORAL_TABLET | Freq: Every evening | ORAL | Status: DC | PRN

## 2015-04-25 MED ORDER — BACITRACIN ZINC 500 UNIT/GM EX OINT
TOPICAL_OINTMENT | CUTANEOUS | Status: AC
  Administered 2015-04-25: 1 via TOPICAL
  Filled 2015-04-25: qty 1.8

## 2015-04-25 MED ORDER — NICOTINE 14 MG/24HR TD PT24
14.0000 mg | MEDICATED_PATCH | Freq: Every day | TRANSDERMAL | Status: DC
  Filled 2015-04-25 (×2): qty 1

## 2015-04-25 MED ORDER — LEVETIRACETAM 500 MG PO TABS
500.0000 mg | ORAL_TABLET | Freq: Two times a day (BID) | ORAL | Status: DC
  Administered 2015-04-25 – 2015-04-26 (×2): 500 mg via ORAL
  Filled 2015-04-25 (×2): qty 1

## 2015-04-25 MED ORDER — LORAZEPAM 1 MG PO TABS
1.0000 mg | ORAL_TABLET | Freq: Four times a day (QID) | ORAL | Status: DC | PRN
  Administered 2015-04-25: 1 mg via ORAL
  Filled 2015-04-25 (×2): qty 1

## 2015-04-25 MED ORDER — ONDANSETRON HCL 4 MG PO TABS: 4.0000 mg | ORAL_TABLET | Freq: Four times a day (QID) | ORAL | Status: DC | PRN

## 2015-04-25 MED ORDER — SODIUM CHLORIDE 0.9 % IV SOLN
1000.0000 mg | Freq: Once | INTRAVENOUS | Status: DC
  Filled 2015-04-25: qty 10

## 2015-04-25 MED ORDER — ACETAMINOPHEN 650 MG RE SUPP: 650.0000 mg | Freq: Four times a day (QID) | RECTAL | Status: DC | PRN

## 2015-04-25 MED ORDER — SODIUM CHLORIDE 0.9% FLUSH: 3.0000 mL | Freq: Two times a day (BID) | INTRAVENOUS | Status: DC

## 2015-04-25 MED ORDER — VITAMIN B-1 100 MG PO TABS
100.0000 mg | ORAL_TABLET | Freq: Every day | ORAL | Status: DC
  Administered 2015-04-25 – 2015-04-26 (×2): 100 mg via ORAL
  Filled 2015-04-25 (×2): qty 1

## 2015-04-25 MED ORDER — SODIUM CHLORIDE 0.9 % IV BOLUS (SEPSIS)
1000.0000 mL | Freq: Once | INTRAVENOUS | Status: AC
  Administered 2015-04-25: 1000 mL via INTRAVENOUS

## 2015-04-25 MED ORDER — ENOXAPARIN SODIUM 40 MG/0.4ML ~~LOC~~ SOLN
40.0000 mg | SUBCUTANEOUS | Status: DC
  Administered 2015-04-25: 40 mg via SUBCUTANEOUS
  Filled 2015-04-25: qty 0.4

## 2015-04-25 MED ORDER — ALUM & MAG HYDROXIDE-SIMETH 200-200-20 MG/5ML PO SUSP: 30.0000 mL | Freq: Four times a day (QID) | ORAL | Status: DC | PRN

## 2015-04-25 MED ORDER — ACETAMINOPHEN 325 MG PO TABS
650.0000 mg | ORAL_TABLET | Freq: Four times a day (QID) | ORAL | Status: DC | PRN
  Administered 2015-04-25 – 2015-04-26 (×2): 650 mg via ORAL
  Filled 2015-04-25 (×2): qty 2

## 2015-04-25 MED ORDER — LORAZEPAM 2 MG/ML IJ SOLN
1.0000 mg | Freq: Four times a day (QID) | INTRAMUSCULAR | Status: DC | PRN
  Administered 2015-04-26: 1 mg via INTRAVENOUS
  Filled 2015-04-25: qty 1
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
## 2015-04-25 NOTE — ED Notes (Signed)
Pt continues to have seizure like activity, MD aware, pt states her chest is tight and a headache, pt asking for xanax, MD notified, pt resting in bed, Sitter at bedside, husband at bedside

## 2015-04-25 NOTE — ED Notes (Signed)
Pt up with assistance to restroom.  Pt A&Ox4.

## 2015-04-25 NOTE — Progress Notes (Signed)
   04/25/15 1930  Clinical Encounter Type  Visited With Patient  Visit Type Initial  Referral From Chaplain  Consult/Referral To Chaplain  Chaplain visited with patient but patient stated she did not want to talk.   Tara Hensley [phone]

## 2015-04-25 NOTE — ED Notes (Signed)
Pharmacy called concerning Keppra IV, stated they would send it

## 2015-04-25 NOTE — ED Notes (Signed)
Pt states "I feel fuzzy", pt then proceeds to have a seizure, body stiff, pt on NRB, MD at bedside

## 2015-04-25 NOTE — H&P (Signed)
[facility] [place] at [facility]   PATIENT NAME: Lyla Jasek    MR#:  [id number]  DATE OF BIRTH:  Oct 24, 1985  DATE OF ADMISSION:  04/25/2015  PRIMARY CARE PHYSICIAN: Dr Foy Guadalajara  REQUESTING/REFERRING PHYSICIAN: Dr. Manson Passey  CHIEF COMPLAINT:  Seizures HISTORY OF PRESENT ILLNESS:  Britlyn Martine  is a 30 y.o. female with a known history of tobacco dependence, EtOH abuse and depression who presents with above complaint. Apparently patient had suicidal attempt yesterday in which she slit her wrist. Patient reports she does not remember this. Boyfriend is at bedside who reports that patient has been doing things with ML the night that she does not  remember the next day. She presented today to the emergency room after a witnessed seizure at work. While in the emergency room she had 2 episodes of tonic-clonic "seizure" seizure. She was not postictal nor did she bite her tongue or have bowel or bladder incontinence. She was given Ativan after these episodes. She reports in the past she has had one seizure in which she did have post ictal confusion and she bit her tongue. This was in the setting of drinking EtOH. He drinks 10 shots a day. She was in alcohol rehabilitation approximate 2 weeks ago for 4 days. She has since then drank EtOH. Her last drink was yesterday. She has a long history of depression and anxiety. She was started on antidepressants several months ago.  PAST MEDICAL HISTORY:   Past Medical History  Diagnosis Date  . Depression   . Anxiety   . Seizures (HCC)     PAST SURGICAL HISTORY:  None  SOCIAL HISTORY:   Social History  Substance Use Topics  . Smoking status: Current Every Day Smoker -- 25.00 packs/day    Types: Cigarettes  . Smokeless tobacco: No  . Alcohol Use: Yes daily drinks 10 shots a day          FAMILY HISTORY:  Father with hypertension  DRUG ALLERGIES:   Allergies  Allergen Reactions  . Claritin  [Loratadine] Hives     REVIEW OF SYSTEMS:  CONSTITUTIONAL: No fever, fatigue or weakness.  EYES: No blurred or double vision.  EARS, NOSE, AND THROAT: No tinnitus or ear pain.  RESPIRATORY: No cough, shortness of breath, wheezing or hemoptysis.  CARDIOVASCULAR: No chest pain, orthopnea, edema.  GASTROINTESTINAL: No nausea, vomiting, diarrhea or abdominal pain.  GENITOURINARY: No dysuria, hematuria.  ENDOCRINE: No polyuria, nocturia,  HEMATOLOGY: No anemia, easy bruising or bleeding SKIN: No rash or lesion. MUSCULOSKELETAL: No joint pain or arthritis.   NEUROLOGIC: No tingling, numbness, weakness.  PSYCHIATRY:++ anxietyand depression.   MEDICATIONS AT HOME:   Prior to Admission medications   Medication Sig Start Date End Date Taking? Authorizing Provider  ALPRAZolam Prudy Feeler) 0.5 MG tablet Take 0.5 mg by mouth 3 (three) times daily as needed for anxiety.   Yes Historical Provider, MD  DULoxetine (CYMBALTA) 60 MG capsule Take 60 mg by mouth daily.   Yes Historical Provider, MD  ibuprofen (ADVIL,MOTRIN) 400 MG tablet Take 400 mg by mouth every 6 (six) hours as needed for mild pain.   Yes Historical Provider, MD      VITAL SIGNS:  Blood pressure 116/72, pulse 94, temperature 98.2 F (36.8 C), temperature source Oral, resp. rate 18, height  (1.6 m), weight 58.968 kg (130 lb), last menstrual period 04/18/2015, SpO2 97 %.  PHYSICAL EXAMINATION:  GENERAL:  30 y.o.-year-old patient lying in the bed with no acute  distress.  EYES: Pupils equal, round, reactive to light and accommodation. No scleral icterus. Extraocular muscles intact.  HEENT: Head atraumatic, normocephalic. Oropharynx and nasopharynx clear.  NECK:  Supple, no jugular venous distention. No thyroid enlargement, no tenderness.  LUNGS: Normal breath sounds bilaterally, no wheezing, rales,rhonchi or crepitation. No use of accessory muscles of respiration.  CARDIOVASCULAR: S1, S2 normal. No murmurs, rubs, or gallops.   ABDOMEN: Soft, nontender, nondistended. Bowel sounds present. No organomegaly or mass.  EXTREMITIES: No pedal edema, cyanosis, or clubbing.  NEUROLOGIC: Cranial nerves II through XII are grossly intact. No focal deficits. PSYCHIATRIC: The patient is alert and oriented x 3.  SKIN: No obvious rash, lesion, or ulcer.  superfuical wirst abrasions  LABORATORY PANEL:   CBC  Recent Labs Lab 04/25/15 0859  WBC 6.2  HGB 16.8*  HCT 48.8*  PLT 392   ------------------------------------------------------------------------------------------------------------------  Chemistries   Recent Labs Lab 04/25/15 0859  NA 140  K 3.7  CL 103  CO2 25  GLUCOSE 82  BUN 15  CREATININE 0.75  CALCIUM 8.6*  AST 48*  ALT 21  ALKPHOS 89  BILITOT 0.7   ------------------------------------------------------------------------------------------------------------------  Cardiac Enzymes No results for input(s): TROPONINI in the last 168 hours. ------------------------------------------------------------------------------------------------------------------  RADIOLOGY:  No results found.  EKG:  Normal sinus rhythm no ST elevation or depression  IMPRESSION AND PLAN:   30 year old female with a history of depression and anxiety, EtOH and tobacco abuse who presents with seizure and suicidal attempt yesterday.  1. Seizures: It is unclear if this is pseudoseizures or real seizures  (organic versus related to EtOH). Patient will need EEG and CT head. Neurology consultation place. Continue neuro checks every 4 hours. No antiepileptics for now.  2. Suicidal attempt: Patient attempted to slit her wrist. Patient needs one-to-one sitter. Psychiatry consultation place. Continue suicidal precautions.   3. Depression and anxiety: Continue Ativan however will hold Cymbalta for now as this could increase suicidal tendencies. Psychiatry consult.  4. EtOH abuse: Patient will be placed on CIWA  protocol.  5. Tobacco dependence: Patient encouraged to stop smoking. Patient counseled for 3 minutes.  All the records are reviewed and case discussed with ED provider. Management plans discussed with the patient and she is in agreement.  CODE STATUS: FULL  TOTAL TIME TAKING CARE OF THIS PATIENT: 55 minutes.    Tavi Gaughran M.D on 04/25/2015 at 11:29 AM  Between 7am to 6pm - Pager - [phone] After 6pm go to [email or web address] - password [email or web address] [facility]  [facility]  Office  [phone]  CC: Primary care physician; No primary care provider on file.
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
## 2015-04-25 NOTE — Progress Notes (Signed)
CT called and state patient is having a seizure, called rapid response. I arrived to CT, patient was alert and oriented, trembling a little bi, and stable. Dr Juliene Pina and Dr Thad Ranger notified. Dr Thad Ranger arrived in CT, no new orders. Dr Thad Ranger will assess patient when patient returns to floor. CT was able to complete scan.

## 2015-04-25 NOTE — ED Notes (Signed)
Pt from work where ems was called due to patient having witnessed seizures. Last night pt also reports she cut her left wrist in an attempt to kill herself. Pt reports long history of depression and anxiety. Pt tearful while triaging and difficult to obtain history.

## 2015-04-25 NOTE — ED Notes (Signed)
Dr. Manson Passey notified of critical ETOH level of 318

## 2015-04-25 NOTE — Care Management Note (Addendum)
Case Management Note  Patient Details  Name: Tara Hensley MRN: [id number] Date of Birth: Dec 20, 1985  Subjective/Objective:     29yo Mrs Tara Hensley was admitted 04/25/15 after a witnessed seizure at work, and had 2 episodes of "clonic tonic" activity in the ED. Does not remember cutting her wrist on 04/24/15. ETOH level upon admission was 318. Hx: ETOH dependence (recent detox) and depression (on Cymbalta). Reportedly drinks 10 "shots" daily. On CIWA  Protocol. Has a 1:1 sitter. Pleasant and cooperative, eating a cheeseburger. PCP=Dr Foy Guadalajara [facility] Pharmacy=[facility] on [address] in [place]. Married and husband is at bedside. Drives own car. Currently pending Neurology and Psychiatric consults.               Action/Plan:   Expected Discharge Date:                  Expected Discharge Plan:     In-House Referral:     Discharge planning Services     Post Acute Care Choice:    Choice offered to:     DME Arranged:    DME Agency:     HH Arranged:    HH Agency:     Status of Service:     Medicare Important Message Given:    Date Medicare IM Given:    Medicare IM give by:    Date Additional Medicare IM Given:    Additional Medicare Important Message give by:     If discussed at Long Length of Stay Meetings, dates discussed:    Additional Comments:  Rodolphe Edmonston A, RN 04/25/2015, 1:19 PM

## 2015-04-25 NOTE — ED Notes (Signed)
Pt resting in bed in no distress, resp even and unlabored 

## 2015-04-25 NOTE — ED Notes (Signed)
1:1 sitter at bedside, seizure padding on bed rails

## 2015-04-25 NOTE — Consult Note (Signed)
Reason for Consult:Seizures Referring Physician: Mody   CC: Seizures  HPI: Tara Hensley is an 30 y.o. female admitted after having a witnessed seizure at work.  Patient reports that she had a head injury as a child at the age of 37.  Reports that she did not start to have seizures until about 30 years of age.  She reports that she did not have a good mother figure and she is not sure what the case was but she was never put on medications.  She has intermittently had seizures since that time.  She reports going a while without seizures, she may then have multiple in a day.  She has never been on medications because she has not been able to afford medications.  Interestingly she does afford her antidepressants.  Last seizures were about 3 months ago.   Patient describes her seizures as her getting a warning of a "fog".  She then feels herself tightening up and will shake.  She reports that it lasts for a few seconds and afterward she is confused.   She reports that she attempted to cut her wrists last evening but was unsuccessful.  She reports having no suicide attempts in the past.  Her psych eval at [facility] suggests otherwise.    Past Medical History  Diagnosis Date  . Depression   . Anxiety   . Seizures (HCC)        Eating disorder as a teen      ETOH abuse s/p rehab a few weeks ago  History reviewed. No pertinent past surgical history.  Family history: Both parents alive and well with no medical problems.  Has three siblings with no medical problems  Social History:  reports that she has been smoking Cigarettes.  She has been smoking about 25.00 packs per day. She does not have any smokeless tobacco history on file. She reports that she drinks alcohol. She dose not report any illicit drug use.    Allergies  Allergen Reactions  . Claritin [Loratadine] Hives    Medications:  I have reviewed the patient's current medications. Prior to Admission:  Prescriptions prior to admission   Medication Sig Dispense Refill Last Dose  . ALPRAZolam (XANAX) 0.5 MG tablet Take 0.5 mg by mouth 3 (three) times daily as needed for anxiety.   prn at prn  . DULoxetine (CYMBALTA) 60 MG capsule Take 60 mg by mouth daily.   Past Week at Unknown time  . ibuprofen (ADVIL,MOTRIN) 400 MG tablet Take 400 mg by mouth every 6 (six) hours as needed for mild pain.   prn at prn   Scheduled: . enoxaparin (LOVENOX) injection  40 mg Subcutaneous Q24H  . folic acid  1 mg Oral Daily  . LORazepam  0-4 mg Oral Q6H   Followed by  . [START ON 04/27/2015] LORazepam  0-4 mg Oral Q12H  . multivitamin with minerals  1 tablet Oral Daily  . nicotine  14 mg Transdermal Daily  . sodium chloride flush  3 mL Intravenous Q12H  . thiamine  100 mg Oral Daily   Or  . thiamine  100 mg Intravenous Daily    ROS: History obtained from the patient  General ROS: negative for - chills, fatigue, fever, night sweats, weight gain or weight loss Psychological ROS: depression, suicidal ideation Ophthalmic ROS: negative for - blurry vision, double vision, eye pain or loss of vision ENT ROS: negative for - epistaxis, nasal discharge, oral lesions, sore throat, tinnitus or vertigo Allergy and  Immunology ROS: negative for - hives or itchy/watery eyes Hematological and Lymphatic ROS: negative for - bleeding problems, bruising or swollen lymph nodes Endocrine ROS: negative for - galactorrhea, hair pattern changes, polydipsia/polyuria or temperature intolerance Respiratory ROS: negative for - cough, hemoptysis, shortness of breath or wheezing Cardiovascular ROS: negative for - chest pain, dyspnea on exertion, edema or irregular heartbeat Gastrointestinal ROS: negative for - abdominal pain, diarrhea, hematemesis, nausea/vomiting or stool incontinence Genito-Urinary ROS: negative for - dysuria, hematuria, incontinence or urinary frequency/urgency Musculoskeletal ROS: wrist pain Neurological ROS: as noted in HPI Dermatological ROS:  negative for rash and skin lesion changes  Physical Examination: Blood pressure 121/71, pulse 82, temperature 98.9 F (37.2 C), temperature source Oral, resp. rate 18, height  (1.6 m), weight 58.968 kg (130 lb), last menstrual period 04/18/2015, SpO2 100 %.  HEENT-  Normocephalic, no lesions, without obvious abnormality.  Normal external eye and conjunctiva.  Normal TM's bilaterally.  Normal auditory canals and external ears. Normal external nose, mucus membranes and septum.  Normal pharynx. Cardiovascular- S1, S2 normal, pulses palpable throughout   Lungs- chest clear, no wheezing, rales, normal symmetric air entry Abdomen- soft, non-tender; bowel sounds normal; no masses,  no organomegaly Extremities- no edema Lymph-no adenopathy palpable Musculoskeletal-no joint tenderness, deformity or swelling Skin-bandaging on left wrist  Neurological Examination Mental Status: Lethargic, oriented, thought content appropriate.  Speech fluent without evidence of aphasia.  Able to follow 3 step commands without difficulty. Cranial Nerves: II: Discs flat bilaterally; Visual fields grossly normal, pupils equal, round, reactive to light and accommodation III,IV, VI: ptosis not present, extra-ocular motions intact bilaterally V,VII: smile symmetric, facial light touch sensation normal bilaterally VIII: hearing normal bilaterally IX,X: gag reflex present XI: bilateral shoulder shrug XII: midline tongue extension Motor: Right : Upper extremity   5/5    Left:     Upper extremity   5/5  Lower extremity   5/5     Lower extremity   5/5 Tone and bulk:normal tone throughout; no atrophy noted Sensory: Pinprick and light touch intact throughout, bilaterally Deep Tendon Reflexes: 2+ and symmetric throughout Plantars: Right: downgoing   Left: downgoing Cerebellar: Normal finger-to-nose and normal heel-to-shin testing bilaterally Gait: not tested due to safety concerns   Laboratory Studies:   Basic  Metabolic Panel:  Recent Labs Lab 04/25/15 0859  NA 140  K 3.7  CL 103  CO2 25  GLUCOSE 82  BUN 15  CREATININE 0.75  CALCIUM 8.6*    Liver Function Tests:  Recent Labs Lab 04/25/15 0859  AST 48*  ALT 21  ALKPHOS 89  BILITOT 0.7  PROT 8.5*  ALBUMIN 4.6   No results for input(s): LIPASE, AMYLASE in the last 168 hours. No results for input(s): AMMONIA in the last 168 hours.  CBC:  Recent Labs Lab 04/25/15 0859  WBC 6.2  HGB 16.8*  HCT 48.8*  MCV 101.0*  PLT 392    Cardiac Enzymes: No results for input(s): CKTOTAL, CKMB, CKMBINDEX, TROPONINI in the last 168 hours.  BNP: Invalid input(s): POCBNP  CBG: No results for input(s): GLUCAP in the last 168 hours.  Microbiology: No results found for this or any previous visit.  Coagulation Studies: No results for input(s): LABPROT, INR in the last 72 hours.  Urinalysis:  Recent Labs Lab 04/25/15 1147  COLORURINE YELLOW*  LABSPEC 1.019  PHURINE 5.0  GLUCOSEU NEGATIVE  HGBUR 2+*  BILIRUBINUR NEGATIVE  KETONESUR TRACE*  PROTEINUR NEGATIVE  NITRITE NEGATIVE  LEUKOCYTESUR NEGATIVE  Lipid Panel:  No results found for: CHOL, TRIG, HDL, CHOLHDL, VLDL, LDLCALC  HgbA1C: No results found for: HGBA1C  Urine Drug Screen:     Component Value Date/Time   LABOPIA NONE DETECTED 04/25/2015 1147   LABBENZ POSITIVE* 04/25/2015 1147   AMPHETMU NONE DETECTED 04/25/2015 1147   THCU NONE DETECTED 04/25/2015 1147   LABBARB NONE DETECTED 04/25/2015 1147    Alcohol Level:  Recent Labs Lab 04/25/15 0859  ETH 318*     Imaging: Ct Head Wo Contrast  04/25/2015  CLINICAL DATA:  Depression and recent suicidal attempt EXAM: CT HEAD WITHOUT CONTRAST TECHNIQUE: Contiguous axial images were obtained from the base of the skull through the vertex without intravenous contrast. COMPARISON:  01/14/2015 FINDINGS: The bony calvarium is intact. The ventricles are of normal size and configuration. No findings to suggest acute  hemorrhage, acute infarction or space-occupying mass lesion are noted. IMPRESSION: No acute intracranial abnormality noted. Electronically Signed   By: Alcide Clever M.D.   On: 04/25/2015 14:07     Assessment/Plan: 30 year old female with a history of seizures.  Concern is for nonepileptic events.  Patient on no anticonvulsant therapy prior to presentation despite what she describes as a 15 year seizure history.  Head CT personally reviewed and shows no acute changes.  Patient loaded with Keppra in the ED.    Recommendations: 1.  Psych consult for management of antidepressants 2.  Seizure precautions 3.  Limit Ativan use for CIWA 4.  Continue maintenance Keppra at  BID 5.  EEG 6.  MRI of the brain with and without contrast   Thana Farr, MD Neurology [phone] 04/25/2015, 2:43 PM

## 2015-04-26 ENCOUNTER — Inpatient Hospital Stay: Payer: 59

## 2015-04-26 DIAGNOSIS — F1994 Other psychoactive substance use, unspecified with psychoactive substance-induced mood disorder: Secondary | ICD-10-CM

## 2015-04-26 DIAGNOSIS — F10939 Alcohol use, unspecified with withdrawal, unspecified: Secondary | ICD-10-CM

## 2015-04-26 DIAGNOSIS — F329 Major depressive disorder, single episode, unspecified: Secondary | ICD-10-CM

## 2015-04-26 DIAGNOSIS — Z7289 Other problems related to lifestyle: Secondary | ICD-10-CM

## 2015-04-26 DIAGNOSIS — F10239 Alcohol dependence with withdrawal, unspecified: Secondary | ICD-10-CM

## 2015-04-26 DIAGNOSIS — IMO0002 Reserved for concepts with insufficient information to code with codable children: Secondary | ICD-10-CM

## 2015-04-26 DIAGNOSIS — F1010 Alcohol abuse, uncomplicated: Secondary | ICD-10-CM

## 2015-04-26 LAB — CBC
HCT: 36.8 % (ref 35.0–47.0)
Hemoglobin: 12.9 g/dL (ref 12.0–16.0)
MCH: 35 pg — ABNORMAL HIGH (ref 26.0–34.0)
MCHC: 35 g/dL (ref 32.0–36.0)
MCV: 100 fL (ref 80.0–100.0)
PLATELETS: 253 10*3/uL (ref 150–440)
RBC: 3.68 MIL/uL — ABNORMAL LOW (ref 3.80–5.20)
RDW: 12.7 % (ref 11.5–14.5)
WBC: 3.8 10*3/uL (ref 3.6–11.0)

## 2015-04-26 LAB — BASIC METABOLIC PANEL
Anion gap: 4 — ABNORMAL LOW (ref 5–15)
BUN: 9 mg/dL (ref 6–20)
CALCIUM: 8 mg/dL — AB (ref 8.9–10.3)
CHLORIDE: 105 mmol/L (ref 101–111)
CO2: 27 mmol/L (ref 22–32)
CREATININE: 0.52 mg/dL (ref 0.44–1.00)
GFR calc Af Amer: >60 mL/min (ref >60)
GFR calc non Af Amer: >60 mL/min (ref >60)
Glucose, Bld: 78 mg/dL (ref 65–99)
Potassium: 3.6 mmol/L (ref 3.5–5.1)
SODIUM: 136 mmol/L (ref 135–145)

## 2015-04-26 MED ORDER — GADOBENATE DIMEGLUMINE 529 MG/ML IV SOLN
15.0000 mL | Freq: Once | INTRAVENOUS | Status: AC | PRN
  Administered 2015-04-26: 12 mL via INTRAVENOUS

## 2015-04-26 MED ORDER — LEVETIRACETAM 500 MG PO TABS: 500.0000 mg | ORAL_TABLET | Freq: Two times a day (BID) | ORAL | Status: DC

## 2015-04-26 NOTE — Discharge Instructions (Signed)
Epilepsy °People with epilepsy have times when they shake and jerk uncontrollably (seizures). This happens when there is a sudden change in brain function. Epilepsy may have many possible causes. Anything that disturbs the normal pattern of brain cell activity can lead to seizures. °HOME CARE  °· Follow your doctor's instructions about driving and safety during normal activities. °· Get enough sleep. °· Only take medicine as told by your doctor. °· Avoid things that you know can cause you to have seizures (triggers). °· Write down when your seizures happen and what you remember about each seizure. Write down anything you think may have caused the seizure to happen. °· Tell the people you live and work with that you have seizures. Make sure they know how to help you. They should: °¨ Cushion your head and body. °¨ Turn you on your side. °¨ Not restrain you. °¨ Not place anything inside your mouth. °¨ Call for local emergency medical help if there is any question about what has happened. °· Keep all follow-up visits with your doctor. This is very important. °GET HELP IF: °· You get an infection or start to feel sick. You may have more seizures when you are sick. °· You are having seizures more often. °· Your seizure pattern is changing. °GET HELP RIGHT AWAY IF:  °· A seizure does not stop after a few seconds or minutes. °· A seizure causes you to have trouble breathing. °· A seizure gives you a very bad headache. °· A seizure makes you unable to speak or use a part of your body. °  °This information is not intended to replace advice given to you by your health care provider. Make sure you discuss any questions you have with your health care provider. °  °Document Released: 12/17/2008 Document Revised: 12/10/2012 Document Reviewed: 10/01/2012 °Elsevier Interactive Patient Education ©2016 Elsevier Inc. ° °

## 2015-04-26 NOTE — Progress Notes (Signed)
[facility] - [place] at [facility] Haye was admitted to the Hospital on 04/25/2015 and Discharged  04/26/2015 and should be excused from work/school   for 2 days starting 04/25/2015 , may return to work/school without any restrictions on 04/27/2015.  Delfino Lovett M.D on 04/26/2015,at 4:12 PM  [facility] - [place] at [facility]  [phone]

## 2015-04-26 NOTE — Progress Notes (Signed)
[facility] - [place] at [facility]   PATIENT NAME: Tara Hensley    MR#:  [id number]  DATE OF BIRTH:  1986/01/23  SUBJECTIVE:  CHIEF COMPLAINT:   Chief Complaint  Patient presents with  . Seizures   very sleepy, wakes up on verbal stimulus, but falls back asleep very easily. sitter at bedside  REVIEW OF SYSTEMS:  Review of Systems  Unable to perform ROS: mental acuity    DRUG ALLERGIES:   Allergies  Allergen Reactions  . Claritin [Loratadine] Hives   VITALS:  Blood pressure 121/69, pulse 82, temperature 98 F (36.7 C), temperature source Oral, resp. rate 18, height  (1.6 m), weight 58.968 kg (130 lb), last menstrual period 04/18/2015, SpO2 100 %. PHYSICAL EXAMINATION:  Physical Exam  Constitutional: She is well-developed, well-nourished, and in no distress.  HENT:  Head: Normocephalic and atraumatic.  Eyes: Conjunctivae and EOM are normal. Pupils are equal, round, and reactive to light.  Neck: Normal range of motion. Neck supple. No tracheal deviation present. No thyromegaly present.  Cardiovascular: Normal rate, regular rhythm and normal heart sounds.   Pulmonary/Chest: Effort normal and breath sounds normal. No respiratory distress. She has no wheezes. She exhibits no tenderness.  Abdominal: Soft. Bowel sounds are normal. She exhibits no distension. There is no tenderness.  Musculoskeletal: Normal range of motion.  Neurological: No cranial nerve deficit.  She is very sleepy and falling back asleep while talking but wakes up on verbal stimulus. Exam overall seems nonfocal and moving her extremites without any difficulties voluntarily.  Skin: Skin is warm and dry. No rash noted.  Psychiatric:  Difficult to evaluate as she is very sleepy   LABORATORY PANEL:   CBC  Recent Labs Lab 04/25/15 0859  WBC 6.2  HGB 16.8*  HCT 48.8*  PLT 392    ------------------------------------------------------------------------------------------------------------------ Chemistries   Recent Labs Lab 04/25/15 0859 04/26/15 0614  NA 140 136  K 3.7 3.6  CL 103 105  CO2 25 27  GLUCOSE 82 78  BUN 15 9  CREATININE 0.75 0.52  CALCIUM 8.6* 8.0*  MG 2.1  --   AST 48*  --   ALT 21  --   ALKPHOS 89  --   BILITOT 0.7  --    RADIOLOGY:  Ct Head Wo Contrast  04/25/2015  CLINICAL DATA:  Depression and recent suicidal attempt EXAM: CT HEAD WITHOUT CONTRAST TECHNIQUE: Contiguous axial images were obtained from the base of the skull through the vertex without intravenous contrast. COMPARISON:  01/14/2015 FINDINGS: The bony calvarium is intact. The ventricles are of normal size and configuration. No findings to suggest acute hemorrhage, acute infarction or space-occupying mass lesion are noted. IMPRESSION: No acute intracranial abnormality noted. Electronically Signed   By: Alcide Clever M.D.   On: 04/25/2015 14:07   ASSESSMENT AND PLAN:  30 year old female with a history of depression and anxiety, EtOH and tobacco abuse who presents with seizure and suicidal attempt yesterday.  1. Seizures: It is unclear if this is pseudoseizures or real seizures (organic versus related to EtOH). Pending EEG and negative CT head. - Pending  MRI of the brain with and without contrast - Appreciate Neurology consultation  - Started Keppra 500 mg twice a day - Seizure precaution  2. Suicidal attempt: Patient attempted to slit her wrist.  Continue one-to-one sitter. Psychiatry consultation pending. Continue suicidal precautions.  3. Depression and anxiety: Continue Ativan however will hold Cymbalta for now as this could increase suicidal tendencies. Psychiatry  consult in place but is pending  4. EtOH abuse: Continue CIWA protocol.  5. Tobacco dependence: Patient encouraged to stop smoking. Patient counseled for 3 minutes by admitting Dr.     All the records  are reviewed and case discussed with Care Management/Social Worker. Management plans discussed with the patient, family and they are in agreement.  CODE STATUS: Full code  TOTAL TIME TAKING CARE OF THIS PATIENT: 35 minutes.   More than 50% of the time was spent in counseling/coordination of care: YES  POSSIBLE D/C IN 1-2 DAYS, DEPENDING ON CLINICAL CONDITION.  Neurology and psychiatry evaluation   Delfino Lovett M.D on 04/26/2015 at 7:07 AM  Between 7am to 6pm - Pager - [phone]  After 6pm go to [email or web address] - password [email or web address] [facility]  [place]  Office  [phone]  CC: Primary care physician; No primary care provider on file.  Note: This dictation was prepared with Dragon dictation along with smaller phrase technology. Any transcriptional errors that result from this process are unintentional.

## 2015-04-26 NOTE — Progress Notes (Signed)
  CONCERNING: IV to Oral Route Change Policy  RECOMMENDATION: This patient is receiving thiamine by the intravenous route.  Based on criteria approved by the Pharmacy and Therapeutics Committee, the intravenous medication(s) is/are being converted to the equivalent oral dose form(s).   DESCRIPTION: These criteria include:  The patient is eating (either orally or via tube) and/or has been taking other orally administered medications for a least 24 hours  The patient has no evidence of active gastrointestinal bleeding or impaired GI absorption (gastrectomy, short bowel, patient on TNA or NPO).  If you have questions about this conversion, please contact the Pharmacy Department    [phone] )  [facility] Hawking   [phone] )  [facility]   [phone] )  [facility]   [phone] )  [facility]   [phone] )  [facility]   [address], [facility] 04/26/2015 10:03 AM

## 2015-04-26 NOTE — Progress Notes (Signed)
Patient refusing EEG per RN Tresa Endo.

## 2015-04-26 NOTE — Progress Notes (Signed)
Pt refused eeg test states she wants to be discharged. Xanax returned to patient prior discharge. Advised to follow up with Medical doctor.

## 2015-04-26 NOTE — Clinical Social Work Note (Signed)
Clinical Social Work Assessment  Patient Details  Name: Tara Hensley MRN: [id number] Date of Birth: Dec 09, 1985  Date of referral:  04/26/15               Reason for consult:  Substance Use/ETOH Abuse                Permission sought to share information with:    Permission granted to share information::     Name::        Agency::     Relationship::     Contact Information:     Housing/Transportation Living arrangements for the past 2 months:  Single Family Home Source of Information:  Patient, Spouse Patient Interpreter Needed:  None Criminal Activity/Legal Involvement Pertinent to Current Situation/Hospitalization:  No - Comment as needed Significant Relationships:  Pets, Spouse Lives with:  Pets, Spouse Do you feel safe going back to the place where you live?  Yes Need for family participation in patient care:  No (Coment)  Care giving concerns:  Patient lives in [place] with her husband Konrad Dolores.    Social Worker assessment / plan:  Holiday representative (CSW) received verbal consult from Dr. Weber Cooks to give patient outpatient substance abuse treatment resources. CSW met with patient and her husband Konrad Dolores was at bedside. Sitter was also at bedside. CSW asked patient if she preferred to speak in private and she stated that her husband could stay in the room. CSW introduced self and explained role of CSW department. Patient reported that she lives in [place] in an apartment with her husband Konrad Dolores. Per patient they have no children and 2 dogs and 3 cats. Patient reported that she works at a [employer] in [place]. Patient reported that she drinksabout 10 shots per day and struggles with anxiety and depression. Patient reported that she has lived in [place] for over 1 year now. Per patient she and her husband moved to [facility] from [place]. Patient reported that she goes to a psychiatrist and is starting with [facility] outpatient substance abuse treatment program. CSW provided patient with  additional outpatient substance abuse resources in [place]. Patient and husband thanked CSW for visit. Please reconsult if future social work needs arise. CSW signing off.   Employment status:  [employer] information:  Managed Care PT Recommendations:  Not assessed at this time Information / Referral to community resources:  Outpatient Substance Abuse Treatment Options  Patient/Family's Response to care:  Patient is starting an outpatient substance abuse treatment program.   Patient/Family's Understanding of and Emotional Response to Diagnosis, Current Treatment, and Prognosis:  Patient and husband were pleasant throughout assessment and thanked CSW for visit.   Emotional Assessment Appearance:  Appears stated age Attitude/Demeanor/Rapport:    Affect (typically observed):  Accepting, Adaptable, Pleasant Orientation:  Oriented to Self, Oriented to Place, Oriented to  Time, Oriented to Situation Alcohol / Substance use:  Alcohol Use Psych involvement (Current and /or in the community):  Yes (Comment)  Discharge Needs  Concerns to be addressed:  No discharge needs identified Readmission within the last 30 days:  No Current discharge risk:  None Barriers to Discharge:  Continued Medical Work up   Loralyn Freshwater, LCSW 04/26/2015, 11:58 AM
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
## 2015-04-26 NOTE — Discharge Summary (Signed)
[facility] - [place] at [facility]   PATIENT NAME: Tara Hensley    MR#:  [id number]  DATE OF BIRTH:  11/22/1985  DATE OF ADMISSION:  04/25/2015 ADMITTING PHYSICIAN: Adrian Saran, MD  DATE OF DISCHARGE: 04/26/2015  3:24 PM  PRIMARY CARE PHYSICIAN: Maryelizabeth Rowan MD   ADMISSION DIAGNOSIS:  Seizure-like activity (HCC) [R56.9] Acute alcohol intoxication, with unspecified complication (HCC) [F10.129]  DISCHARGE DIAGNOSIS:  Principal Problem:   Substance induced mood disorder (HCC) Active Problems:   Seizure (HCC)   Alcohol abuse   Major depression (HCC)   Alcohol withdrawal (HCC)   Self-inflicted injury  SECONDARY DIAGNOSIS:   Past Medical History  Diagnosis Date  . Depression   . Anxiety   . Seizures [facility])     HOSPITAL COURSE:  30 year old female with a history of depression and anxiety, EtOH and tobacco abuse admitted with seizure and suicidal attempt   1. Seizures: - will be D/Ced on Keppra 500 mg twice a day per neuro - Seizure precaution  2. Suicidal attempt: Psych cleared her. And D/C IVC also.   3. Depression and anxiety: Stopped Cymbalta per Neuro and psych recommendation.  In ideal situation, I would like to keep her one more night just to watch her for possible alcohol withdrawal or delirium tremens but she is adamant in wanting to leave AGAINST MEDICAL ADVICE.  I have gone over risks and benefits of leaving AMA.  She and her husband are in agreement in wanting to leave as they leave in [place] and are agreeable to see psychiatry there as soon as possible.  He has been given all resources for crisis management, also requested to go to the nearest emergency department if her condition get worse.  DISCHARGE CONDITIONS:   Stable  CONSULTS OBTAINED:  Treatment Team:  Kym Groom, MD Audery Amel, MD  DRUG ALLERGIES:   Allergies  Allergen Reactions  . Claritin [Loratadine] Hives    DISCHARGE MEDICATIONS:    Discharge Medication List as of 04/26/2015  2:55 PM    START taking these medications   Details  levETIRAcetam (KEPPRA) 500 MG tablet Take 1 tablet (500 mg total) by mouth 2 (two) times daily., Starting 04/26/2015, Until Discontinued, Normal      CONTINUE these medications which have NOT CHANGED   Details  ALPRAZolam (XANAX) 0.5 MG tablet Take 0.5 mg by mouth 3 (three) times daily as needed for anxiety., Until Discontinued, Historical Med    ibuprofen (ADVIL,MOTRIN) 400 MG tablet Take 400 mg by mouth every 6 (six) hours as needed for mild pain., Until Discontinued, Historical Med      STOP taking these medications     DULoxetine (CYMBALTA) 60 MG capsule          DISCHARGE INSTRUCTIONS:    DIET:  Regular diet  DISCHARGE CONDITION:  Good  ACTIVITY:  Activity as tolerated  OXYGEN:  Home Oxygen: No.   Oxygen Delivery: room air  DISCHARGE LOCATION:  home   If you experience worsening of your admission symptoms, develop shortness of breath, life threatening emergency, suicidal or homicidal thoughts you must seek medical attention immediately by calling 911 or calling your MD immediately  if symptoms less severe.  You Must read complete instructions/literature along with all the possible adverse reactions/side effects for all the Medicines you take and that have been prescribed to you. Take any new Medicines after you have completely understood and accpet all the possible adverse reactions/side effects.   Please note  You  were cared for by a hospitalist during your hospital stay. If you have any questions about your discharge medications or the care you received while you were in the hospital after you are discharged, you can call the unit and asked to speak with the hospitalist on call if the hospitalist that took care of you is not available. Once you are discharged, your primary care physician will handle any further medical issues. Please note that NO REFILLS for any  discharge medications will be authorized once you are discharged, as it is imperative that you return to your primary care physician (or establish a relationship with a primary care physician if you do not have one) for your aftercare needs so that they can reassess your need for medications and monitor your lab values.    On the day of Discharge:  VITAL SIGNS:  Blood pressure 129/77, pulse 70, temperature 97.7 F (36.5 C), temperature source Oral, resp. rate 18, height 5\' 3"  (1.6 m), weight 58.968 kg (130 lb), last menstrual period 04/18/2015, SpO2 100 %.  PHYSICAL EXAMINATION:  GENERAL:  30 y.o.-year-old patient lying in the bed with no acute distress.  EYES: Pupils equal, round, reactive to light and accommodation. No scleral icterus. Extraocular muscles intact.  HEENT: Head atraumatic, normocephalic. Oropharynx and nasopharynx clear.  NECK:  Supple, no jugular venous distention. No thyroid enlargement, no tenderness.  LUNGS: Normal breath sounds bilaterally, no wheezing, rales,rhonchi or crepitation. No use of accessory muscles of respiration.  CARDIOVASCULAR: S1, S2 normal. No murmurs, rubs, or gallops.  ABDOMEN: Soft, non-tender, non-distended. Bowel sounds present. No organomegaly or mass.  EXTREMITIES: No pedal edema, cyanosis, or clubbing.  NEUROLOGIC: Cranial nerves II through XII are intact. Muscle strength 5/5 in all extremities. Sensation intact. Gait not checked.  PSYCHIATRIC: The patient is alert and oriented x 3.  SKIN: No obvious rash, lesion, or ulcer.  DATA REVIEW:   CBC  Recent Labs Lab 04/26/15 0614  WBC 3.8  HGB 12.9  HCT 36.8  PLT 253    Chemistries   Recent Labs Lab 04/25/15 0859 04/26/15 0614  NA 140 136  K 3.7 3.6  CL 103 105  CO2 25 27  GLUCOSE 82 78  BUN 15 9  CREATININE 0.75 0.52  CALCIUM 8.6* 8.0*  MG 2.1  --   AST 48*  --   ALT 21  --   ALKPHOS 89  --   BILITOT 0.7  --     Cardiac Enzymes No results for input(s): TROPONINI in the  last 168 hours.  Microbiology Results  No results found for this or any previous visit.  RADIOLOGY:  Ct Head Wo Contrast  04/25/2015  CLINICAL DATA:  Depression and recent suicidal attempt EXAM: CT HEAD WITHOUT CONTRAST TECHNIQUE: Contiguous axial images were obtained from the base of the skull through the vertex without intravenous contrast. COMPARISON:  01/14/2015 FINDINGS: The bony calvarium is intact. The ventricles are of normal size and configuration. No findings to suggest acute hemorrhage, acute infarction or space-occupying mass lesion are noted. IMPRESSION: No acute intracranial abnormality noted. Electronically Signed   By: Alcide Clever M.D.   On: 04/25/2015 14:07   Mr Laqueta Jean ZO Contrast  04/26/2015  CLINICAL DATA:  30 year old female with history of alcohol abuse and depression post suicide attempt. Seizure. Subsequent encounter. EXAM: MRI HEAD WITHOUT AND WITH CONTRAST TECHNIQUE: Multiplanar, multiecho pulse sequences of the brain and surrounding structures were obtained without and with intravenous contrast. CONTRAST:  12mL MULTIHANCE GADOBENATE DIMEGLUMINE  529 MG/ML IV SOLN COMPARISON:  04/25/2015 and 01/14/2015 head CT. FINDINGS: Exam is motion degraded. No acute infarct. On diffusion sequence, slight increased signal within the mid to posterior temporal lobes bilaterally. I suspect this is related to artifact arising from the skullbase rather than result of seizure activity or infection. No evidence of mesial temporal sclerosis. No intracranial hemorrhage. No MR evidence of Wernicke's encephalopathy. No intracranial mass or abnormal enhancement. Major intracranial vascular structures are patent. Cervical medullary junction, pituitary region, pineal region and orbital structures unremarkable. Minimal mucosal thickening ethmoid sinus air cells with minimal polypoid opacification sphenoid sinus air cells. IMPRESSION: Exam is motion degraded. No acute infarct or intracranial hemorrhage. No  evidence of mesial temporal sclerosis or Wernicke's encephalopathy. No intracranial mass or abnormal enhancement. Minimal mucosal thickening ethmoid sinus air cells with minimal polypoid opacification sphenoid sinus air cells. Electronically Signed   By: Lacy Duverney M.D.   On: 04/26/2015 12:45     Management plans discussed with the patient, family and they are in agreement.  CODE STATUS:     Code Status Orders        Start     Ordered   04/25/15 1249  Full code   Continuous     04/25/15 1248    Code Status History    Date Active Date Inactive Code Status Order ID Comments User Context   This patient has a current code status but no historical code status.      TOTAL TIME TAKING CARE OF THIS PATIENT: 45 minutes.    [facility], Yobani Schertzer M.D on 04/26/2015 at 6:03 PM  Between 7am to 6pm - Pager - [phone]  After 6pm go to [email or web address] - password [email or web address]  [facility]  Office  [phone]  CC: Primary care physician; Maryelizabeth Rowan MD  Note: This dictation was prepared with Dragon dictation along with smaller phrase technology. Any transcriptional errors that result from this process are unintentional.

## 2015-04-26 NOTE — Care Management Note (Signed)
Case Management Note  Patient Details  Name: Tara Hensley MRN: [id number] Date of Birth: [birth year]  Subjective/Objective:         Dr Toni Amend in to see [name].            Action/Plan:   Expected Discharge Date:                  Expected Discharge Plan:     In-House Referral:     Discharge planning Services     Post Acute Care Choice:    Choice offered to:     DME Arranged:    DME Agency:     HH Arranged:    HH Agency:     Status of Service:     Medicare Important Message Given:    Date Medicare IM Given:    Medicare IM give by:    Date Additional Medicare IM Given:    Additional Medicare Important Message give by:     If discussed at Long Length of Stay Meetings, dates discussed:    Additional Comments:  Kathrynne Kulinski A, RN 04/26/2015, 11:25 AM

## 2015-05-17 ENCOUNTER — Encounter (HOSPITAL_COMMUNITY): Payer: Self-pay | Admitting: Emergency Medicine

## 2015-05-17 ENCOUNTER — Emergency Department (HOSPITAL_COMMUNITY): Payer: 59

## 2015-05-17 ENCOUNTER — Emergency Department (HOSPITAL_COMMUNITY)
Admission: EM | Admit: 2015-05-17 | Discharge: 2015-05-18 | Disposition: A | Discharge status: Home / Self Care | Payer: 59 | Attending: Emergency Medicine | Admitting: Emergency Medicine

## 2015-05-17 DIAGNOSIS — F1010 Alcohol abuse, uncomplicated: Secondary | ICD-10-CM | POA: Diagnosis present

## 2015-05-17 DIAGNOSIS — Z79899 Other long term (current) drug therapy: Secondary | ICD-10-CM | POA: Diagnosis not present

## 2015-05-17 DIAGNOSIS — Z008 Encounter for other general examination: Secondary | ICD-10-CM | POA: Diagnosis present

## 2015-05-17 DIAGNOSIS — F10129 Alcohol abuse with intoxication, unspecified: Secondary | ICD-10-CM | POA: Insufficient documentation

## 2015-05-17 DIAGNOSIS — F329 Major depressive disorder, single episode, unspecified: Secondary | ICD-10-CM | POA: Insufficient documentation

## 2015-05-17 DIAGNOSIS — F330 Major depressive disorder, recurrent, mild: Secondary | ICD-10-CM | POA: Diagnosis not present

## 2015-05-17 DIAGNOSIS — F17210 Nicotine dependence, cigarettes, uncomplicated: Secondary | ICD-10-CM | POA: Insufficient documentation

## 2015-05-17 DIAGNOSIS — F419 Anxiety disorder, unspecified: Secondary | ICD-10-CM | POA: Insufficient documentation

## 2015-05-17 DIAGNOSIS — F1310 Sedative, hypnotic or anxiolytic abuse, uncomplicated: Secondary | ICD-10-CM | POA: Diagnosis not present

## 2015-05-17 LAB — RAPID URINE DRUG SCREEN, HOSP PERFORMED
AMPHETAMINES: NOT DETECTED
BARBITURATES: NOT DETECTED
BENZODIAZEPINES: POSITIVE — AB
Cocaine: NOT DETECTED
Opiates: NOT DETECTED
TETRAHYDROCANNABINOL: NOT DETECTED

## 2015-05-17 LAB — ACETAMINOPHEN LEVEL: Acetaminophen (Tylenol), Serum: <10 ug/mL — ABNORMAL LOW (ref 10–30)

## 2015-05-17 LAB — COMPREHENSIVE METABOLIC PANEL
ALK PHOS: 72 U/L (ref 38–126)
ALT: 35 U/L (ref 14–54)
AST: 66 U/L — AB (ref 15–41)
Albumin: 4.1 g/dL (ref 3.5–5.0)
Anion gap: 10 (ref 5–15)
BILIRUBIN TOTAL: 1.2 mg/dL (ref 0.3–1.2)
BUN: 14 mg/dL (ref 6–20)
CALCIUM: 8.3 mg/dL — AB (ref 8.9–10.3)
CHLORIDE: 102 mmol/L (ref 101–111)
CO2: 25 mmol/L (ref 22–32)
CREATININE: 0.65 mg/dL (ref 0.44–1.00)
GFR calc Af Amer: >60 mL/min (ref >60)
Glucose, Bld: 82 mg/dL (ref 65–99)
Potassium: 3.8 mmol/L (ref 3.5–5.1)
Sodium: 137 mmol/L (ref 135–145)
Total Protein: 7.2 g/dL (ref 6.5–8.1)

## 2015-05-17 LAB — CBC
HCT: 43.9 % (ref 36.0–46.0)
Hemoglobin: 15.6 g/dL — ABNORMAL HIGH (ref 12.0–15.0)
MCH: 35.1 pg — AB (ref 26.0–34.0)
MCHC: 35.5 g/dL (ref 30.0–36.0)
MCV: 98.7 fL (ref 78.0–100.0)
PLATELETS: 279 10*3/uL (ref 150–400)
RBC: 4.45 MIL/uL (ref 3.87–5.11)
RDW: 12.8 % (ref 11.5–15.5)
WBC: 4.9 10*3/uL (ref 4.0–10.5)

## 2015-05-17 LAB — ETHANOL: ALCOHOL ETHYL (B): 260 mg/dL — AB (ref <5)

## 2015-05-17 LAB — SALICYLATE LEVEL: Salicylate Lvl: <4 mg/dL (ref 2.8–30.0)

## 2015-05-17 MED ORDER — LORAZEPAM 2 MG/ML IJ SOLN
2.0000 mg | Freq: Once | INTRAMUSCULAR | Status: AC
  Administered 2015-05-17: 2 mg via INTRAMUSCULAR
  Filled 2015-05-17: qty 1

## 2015-05-17 MED ORDER — DIPHENHYDRAMINE HCL 50 MG/ML IJ SOLN
50.0000 mg | Freq: Once | INTRAMUSCULAR | Status: AC
  Administered 2015-05-17: 50 mg via INTRAMUSCULAR
  Filled 2015-05-17: qty 1
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
## 2015-05-17 NOTE — ED Notes (Signed)
Patient brought alprazolam 0.5 mg tablets, counted 42 tablets present. Duloxetine dr 60mg  capusles, counted 18 capsules present. Verified by Erick Colacehristina Infinger, RN.

## 2015-05-17 NOTE — ED Notes (Signed)
UNABLE TO COLLECT LABS BECAUSE OF PATIENT BEHAVIOR 

## 2015-05-17 NOTE — ED Notes (Signed)
Security at bedside
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
## 2015-05-17 NOTE — ED Notes (Signed)
IVC papers arrived via [employer] 
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
## 2015-05-18 ENCOUNTER — Telehealth: Payer: Self-pay | Admitting: *Deleted

## 2015-05-18 DIAGNOSIS — F1010 Alcohol abuse, uncomplicated: Secondary | ICD-10-CM

## 2015-05-18 DIAGNOSIS — F330 Major depressive disorder, recurrent, mild: Secondary | ICD-10-CM

## 2015-05-18 MED ORDER — HYDROXYZINE HCL 25 MG PO TABS: 25.0000 mg | ORAL_TABLET | Freq: Two times a day (BID) | ORAL | Status: DC

## 2015-05-18 MED ORDER — CARBAMAZEPINE 200 MG PO TABS: 100.0000 mg | ORAL_TABLET | Freq: Two times a day (BID) | ORAL | Status: DC

## 2015-05-18 MED ORDER — CARBAMAZEPINE 200 MG PO TABS
100.0000 mg | ORAL_TABLET | Freq: Two times a day (BID) | ORAL | Status: DC
  Filled 2015-05-18 (×2): qty 0.5

## 2015-05-18 MED ORDER — CARBAMAZEPINE 200 MG PO TABS: 200.0000 mg | ORAL_TABLET | Freq: Two times a day (BID) | ORAL | Status: DC
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
## 2015-05-18 NOTE — Discharge Instructions (Signed)
For your ongoing mental health needs, you are advised to follow up with [facility].  They are able to see patients regardless of ability to pay for treatment.  New and returning patients are seen at their walk-in clinic.  Walk-in hours are Monday - Friday from 8:00 am - 3:00 pm.  Walk-in patients are seen on a first come, first served basis.  Try to arrive as early as possible for he best chance of being seen the same day:       [facility]      [address]      [place], [place] [address]      [phone]

## 2015-05-18 NOTE — BHH Counselor (Signed)
Correct contact info for pt's husband: Edythe Lynnhomas Gary, [phone]

## 2015-05-18 NOTE — BHH Counselor (Signed)
Psych disposition: Per Donell SievertSpencer Simon, PA-C, Pt to be observed overnight and re-assessed in the morning.  Dr. Clarene DukeLittle informed of disposition.    - Cyndie MullAnna Ceylin Dreibelbis, [facility]   Therapeutic Triage    Ext [phone]
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
## 2015-05-18 NOTE — ED Notes (Signed)
Pt discharged home per MD order. Discharge summary and rx reviewed with pt. Pt verbalizes understanding of discharge summary. Pt denies SI/HI AVH at discharge. Pt denies s/s of withdrawal at discharge. This nurse picked up  home medication  from pharmacy, pt signed she received medication, and medication given to pt at discharge. Pt ambulatory out of facility.

## 2015-05-18 NOTE — ED Notes (Addendum)
Pt presents to psych ed, agitated, screaming banging on locked doors to get out of department. Denies wrapping a cord around her neck as SI attempt.  PA Spencer contacted for orders.  [employer] and security at bedside for assistance.  Monitoring for safety, Q 15 min checks in effect, no distress noted. Pt is IVC.

## 2015-08-08 ENCOUNTER — Emergency Department (HOSPITAL_BASED_OUTPATIENT_CLINIC_OR_DEPARTMENT_OTHER): Payer: 59

## 2015-08-08 ENCOUNTER — Emergency Department (HOSPITAL_BASED_OUTPATIENT_CLINIC_OR_DEPARTMENT_OTHER)
Admission: EM | Admit: 2015-08-08 | Discharge: 2015-08-08 | Disposition: A | Discharge status: Home / Self Care | Payer: 59 | Attending: Emergency Medicine | Admitting: Emergency Medicine

## 2015-08-08 ENCOUNTER — Encounter (HOSPITAL_BASED_OUTPATIENT_CLINIC_OR_DEPARTMENT_OTHER): Payer: Self-pay | Admitting: *Deleted

## 2015-08-08 DIAGNOSIS — F17210 Nicotine dependence, cigarettes, uncomplicated: Secondary | ICD-10-CM | POA: Diagnosis not present

## 2015-08-08 DIAGNOSIS — R102 Pelvic and perineal pain: Secondary | ICD-10-CM

## 2015-08-08 DIAGNOSIS — F329 Major depressive disorder, single episode, unspecified: Secondary | ICD-10-CM | POA: Insufficient documentation

## 2015-08-08 DIAGNOSIS — N72 Inflammatory disease of cervix uteri: Secondary | ICD-10-CM | POA: Diagnosis not present

## 2015-08-08 DIAGNOSIS — M545 Low back pain: Secondary | ICD-10-CM | POA: Diagnosis present

## 2015-08-08 LAB — COMPREHENSIVE METABOLIC PANEL
ALK PHOS: 66 U/L (ref 38–126)
ALT: 13 U/L — ABNORMAL LOW (ref 14–54)
ANION GAP: 8 (ref 5–15)
AST: 24 U/L (ref 15–41)
Albumin: 3.8 g/dL (ref 3.5–5.0)
BILIRUBIN TOTAL: 0.5 mg/dL (ref 0.3–1.2)
BUN: 15 mg/dL (ref 6–20)
CALCIUM: 8.3 mg/dL — AB (ref 8.9–10.3)
CO2: 23 mmol/L (ref 22–32)
Chloride: 107 mmol/L (ref 101–111)
Creatinine, Ser: 0.65 mg/dL (ref 0.44–1.00)
Glucose, Bld: 86 mg/dL (ref 65–99)
POTASSIUM: 3.8 mmol/L (ref 3.5–5.1)
Sodium: 138 mmol/L (ref 135–145)
TOTAL PROTEIN: 6.9 g/dL (ref 6.5–8.1)

## 2015-08-08 LAB — CBC WITH DIFFERENTIAL/PLATELET
BASOS ABS: 0 10*3/uL (ref 0.0–0.1)
BASOS PCT: 0 %
EOS ABS: 0.1 10*3/uL (ref 0.0–0.7)
Eosinophils Relative: 1 %
HEMATOCRIT: 40.7 % (ref 36.0–46.0)
HEMOGLOBIN: 14.4 g/dL (ref 12.0–15.0)
Lymphocytes Relative: 19 %
Lymphs Abs: 2.3 10*3/uL (ref 0.7–4.0)
MCH: 35.6 pg — ABNORMAL HIGH (ref 26.0–34.0)
MCHC: 35.4 g/dL (ref 30.0–36.0)
MCV: 100.5 fL — ABNORMAL HIGH (ref 78.0–100.0)
MONOS PCT: 4 %
Monocytes Absolute: 0.5 10*3/uL (ref 0.1–1.0)
NEUTROS ABS: 9.2 10*3/uL — AB (ref 1.7–7.7)
NEUTROS PCT: 76 %
Platelets: 285 10*3/uL (ref 150–400)
RBC: 4.05 MIL/uL (ref 3.87–5.11)
RDW: 11.6 % (ref 11.5–15.5)
WBC: 12.2 10*3/uL — ABNORMAL HIGH (ref 4.0–10.5)

## 2015-08-08 LAB — URINALYSIS, ROUTINE W REFLEX MICROSCOPIC
BILIRUBIN URINE: NEGATIVE
GLUCOSE, UA: NEGATIVE mg/dL
HGB URINE DIPSTICK: NEGATIVE
KETONES UR: NEGATIVE mg/dL
LEUKOCYTES UA: NEGATIVE
Nitrite: NEGATIVE
PH: 5 (ref 5.0–8.0)
PROTEIN: NEGATIVE mg/dL
Specific Gravity, Urine: 1.013 (ref 1.005–1.030)

## 2015-08-08 LAB — PREGNANCY, URINE: Preg Test, Ur: NEGATIVE

## 2015-08-08 LAB — LIPASE, BLOOD: LIPASE: 28 U/L (ref 11–51)

## 2015-08-08 MED ORDER — ONDANSETRON HCL 4 MG PO TABS: 4.0000 mg | ORAL_TABLET | Freq: Four times a day (QID) | ORAL | Status: DC

## 2015-08-08 MED ORDER — ONDANSETRON HCL 4 MG/2ML IJ SOLN
4.0000 mg | Freq: Once | INTRAMUSCULAR | Status: AC
  Administered 2015-08-08: 4 mg via INTRAVENOUS
  Filled 2015-08-08: qty 2

## 2015-08-08 MED ORDER — DEXTROSE 5 % IV SOLN
1.0000 g | Freq: Once | INTRAVENOUS | Status: AC
  Administered 2015-08-08: 1 g via INTRAVENOUS
  Filled 2015-08-08: qty 10

## 2015-08-08 MED ORDER — NAPROXEN 500 MG PO TABS: 500.0000 mg | ORAL_TABLET | Freq: Two times a day (BID) | ORAL | Status: DC

## 2015-08-08 MED ORDER — DOXYCYCLINE HYCLATE 100 MG PO CAPS: 100.0000 mg | ORAL_CAPSULE | Freq: Two times a day (BID) | ORAL | Status: DC

## 2015-08-08 MED ORDER — SODIUM CHLORIDE 0.9 % IV BOLUS (SEPSIS)
1000.0000 mL | Freq: Once | INTRAVENOUS | Status: AC
  Administered 2015-08-08: 1000 mL via INTRAVENOUS

## 2015-08-08 MED ORDER — SODIUM CHLORIDE 0.9 % IV SOLN
INTRAVENOUS | Status: DC
  Administered 2015-08-08: 09:00:00 via INTRAVENOUS

## 2015-08-08 MED ORDER — HYDROMORPHONE HCL 1 MG/ML IJ SOLN
0.5000 mg | INTRAMUSCULAR | Status: AC | PRN
  Administered 2015-08-08 (×3): 0.5 mg via INTRAVENOUS
  Filled 2015-08-08 (×3): qty 1

## 2015-08-08 NOTE — ED Notes (Signed)
C/o bil lower back pain, unable to urinate with burning on urination. No fever but c/o n/v.

## 2015-08-08 NOTE — ED Provider Notes (Signed)
CSN: [id number]     Arrival date & time 08/08/15  0709 History   First MD Initiated Contact with Patient 08/08/15 [id number]     Chief complaint: Back pain  HPI The patient presents to the emergency room with complaints of lower back pain that started last week. The symptoms have been getting progressively worse and today the pain is more severe. Patient has noticed pain in her lower back and mid back on both sides. She's been having a burning sensation when she urinates. She also has been urinating frequently but whenever she goes it is just a small amount. She has been having some intermittent episodes of vomiting. Today she had several episodes. She denies any abdominal pain or diarrhea. She denies any fevers.  She is currently on her menses. Past Medical History  Diagnosis Date  . Depression   . Anxiety   . Seizures (HCC)    History reviewed. No pertinent past surgical history. No family history on file. Social History  Substance Use Topics  . Smoking status: Current Every Day Smoker -- 0.50 packs/day    Types: Cigarettes  . Smokeless tobacco: None  . Alcohol Use: Yes     Comment: occasionally   OB History    No data available     Review of Systems  Genitourinary: Negative for vaginal bleeding, vaginal discharge and vaginal pain.  All other systems reviewed and are negative.     Allergies  Claritin  Home Medications   Prior to Admission medications   Medication Sig Start Date End Date Taking? Authorizing Provider  doxycycline (VIBRAMYCIN) 100 MG capsule Take 1 capsule (100 mg total) by mouth 2 (two) times daily. 08/08/15   Linwood DibblesJon Paysley Poplar, MD  naproxen (NAPROSYN) 500 MG tablet Take 1 tablet (500 mg total) by mouth 2 (two) times daily with a meal. As needed for pain 08/08/15   Linwood DibblesJon Renner Sebald, MD  ondansetron (ZOFRAN) 4 MG tablet Take 1 tablet (4 mg total) by mouth every 6 (six) hours. 08/08/15   Linwood DibblesJon Alesha Jaffee, MD   BP 109/75 mmHg  Pulse 60  Temp(Src) 97.9 F (36.6 C) (Oral)  Resp 18  Ht 5\' 3"   (1.6 m)  Wt 61.236 kg  BMI 23.92 kg/m2  SpO2 99%  LMP 08/04/2015 Physical Exam  Constitutional:  Appears uncomfortable   HENT:  Head: Normocephalic and atraumatic.  Right Ear: External ear normal.  Left Ear: External ear normal.  Mouth/Throat: No oropharyngeal exudate.  Eyes: Conjunctivae are normal. Right eye exhibits no discharge. Left eye exhibits no discharge. No scleral icterus.  Neck: Neck supple. No tracheal deviation present.  Cardiovascular: Normal rate, regular rhythm and intact distal pulses.   Pulmonary/Chest: Effort normal and breath sounds normal. No stridor. No respiratory distress. She has no wheezes. She has no rales.  Abdominal: Soft. Bowel sounds are normal. She exhibits no distension. There is no tenderness. There is CVA tenderness (bilaterally). There is no rebound and no guarding.  Genitourinary: There is no tenderness or lesion on the right labia. There is no tenderness or lesion on the left labia. Uterus is tender. Cervix exhibits no discharge. Right adnexum displays no mass and no tenderness. Left adnexum displays tenderness. Left adnexum displays no mass. No tenderness in the vagina. No foreign body around the vagina. No signs of injury around the vagina. No vaginal discharge found.  Small amount of blood at the cervical os  Musculoskeletal: She exhibits no edema or tenderness.  Neurological: She is alert. She has normal strength. No  cranial nerve deficit (no facial droop, extraocular movements intact, no slurred speech) or sensory deficit. She exhibits normal muscle tone. She displays no seizure activity. Coordination normal.  Skin: Skin is warm and dry. No rash noted. She is not diaphoretic.  Psychiatric: She has a normal mood and affect.  Nursing note and vitals reviewed.   ED Course  Procedures   Medications  sodium chloride 0.9 % bolus 1,000 mL (0 mLs Intravenous Stopped 08/08/15 0845)    And  0.9 %  sodium chloride infusion ( Intravenous New Bag/Given  08/08/15 0858)  HYDROmorphone (DILAUDID) injection 0.5 mg (0.5 mg Intravenous Given 08/08/15 1036)  ondansetron (ZOFRAN) injection 4 mg (4 mg Intravenous Given 08/08/15 0743)  cefTRIAXone (ROCEPHIN) 1 g in dextrose 5 % 50 mL IVPB (0 g Intravenous Stopped 08/08/15 1000)    Labs Review Labs Reviewed  URINALYSIS, ROUTINE W REFLEX MICROSCOPIC (NOT AT [facility]) - Abnormal; Notable for the following:    APPearance CLOUDY (*)    All other components within normal limits  COMPREHENSIVE METABOLIC PANEL - Abnormal; Notable for the following:    Calcium 8.3 (*)    ALT 13 (*)    All other components within normal limits  CBC WITH DIFFERENTIAL/PLATELET - Abnormal; Notable for the following:    WBC 12.2 (*)    MCV 100.5 (*)    MCH 35.6 (*)    Neutro Abs 9.2 (*)    All other components within normal limits  PREGNANCY, URINE  LIPASE, BLOOD  RPR  HIV ANTIBODY (ROUTINE TESTING)  GC/CHLAMYDIA PROBE AMP ([place]) NOT AT [facility]    Imaging Review US Transvaginal Non-ob  08/08/2015  CLINICAL DATA:  Bilateral flank pain, nausea, vomiting for 4 days EXAM: TRANSABDOMINAL AND TRANSVAGINAL ULTRASOUND OF PELVIS DOPPLER ULTRASOUND OF OVARIES TECHNIQUE: Both transabdominal and transvaginal ultrasound examinations of the pelvis were performed. Transabdominal technique was performed for global imaging of the pelvis including uterus, ovaries, adnexal regions, and pelvic cul-de-sac. It was necessary to proceed with endovaginal exam following the transabdominal exam to visualize the uterus, endometrium, ovaries and adnexa . Color and duplex Doppler ultrasound was utilized to evaluate blood flow to the ovaries. COMPARISON:  None. FINDINGS: Uterus Measurements: 8.1 x 3.3 x 4.7 cm. No fibroids or other mass visualized. Endometrium Thickness: 2 mm in thickness.  No focal abnormality visualized. Right ovary Measurements: 2.9 x 1.9 x 2.1 cm. Normal appearance/no adnexal mass. Left ovary Measurements: 3.3 x 1.9 x 2.2 cm. Multiple small  follicles. Normal appearance/no adnexal mass. Pulsed Doppler evaluation of both ovaries demonstrates normal low-resistance arterial and venous waveforms. Other findings Trace free fluid in the pelvis IMPRESSION: Unremarkable pelvic ultrasound. Electronically Signed   By: Charlett Nose M.D.   On: 08/08/2015 10:57   US Pelvis Complete  08/08/2015  CLINICAL DATA:  Bilateral flank pain, nausea, vomiting for 4 days EXAM: TRANSABDOMINAL AND TRANSVAGINAL ULTRASOUND OF PELVIS DOPPLER ULTRASOUND OF OVARIES TECHNIQUE: Both transabdominal and transvaginal ultrasound examinations of the pelvis were performed. Transabdominal technique was performed for global imaging of the pelvis including uterus, ovaries, adnexal regions, and pelvic cul-de-sac. It was necessary to proceed with endovaginal exam following the transabdominal exam to visualize the uterus, endometrium, ovaries and adnexa . Color and duplex Doppler ultrasound was utilized to evaluate blood flow to the ovaries. COMPARISON:  None. FINDINGS: Uterus Measurements: 8.1 x 3.3 x 4.7 cm. No fibroids or other mass visualized. Endometrium Thickness: 2 mm in thickness.  No focal abnormality visualized. Right ovary Measurements: 2.9 x 1.9 x  2.1 cm. Normal appearance/no adnexal mass. Left ovary Measurements: 3.3 x 1.9 x 2.2 cm. Multiple small follicles. Normal appearance/no adnexal mass. Pulsed Doppler evaluation of both ovaries demonstrates normal low-resistance arterial and venous waveforms. Other findings Trace free fluid in the pelvis IMPRESSION: Unremarkable pelvic ultrasound. Electronically Signed   By: Charlett Nose M.D.   On: 08/08/2015 10:57   [place] Art/ven Flow Abd Pelv Doppler  08/08/2015  CLINICAL DATA:  Bilateral flank pain, nausea, vomiting for 4 days EXAM: TRANSABDOMINAL AND TRANSVAGINAL ULTRASOUND OF PELVIS DOPPLER ULTRASOUND OF OVARIES TECHNIQUE: Both transabdominal and transvaginal ultrasound examinations of the pelvis were performed. Transabdominal technique was  performed for global imaging of the pelvis including uterus, ovaries, adnexal regions, and pelvic cul-de-sac. It was necessary to proceed with endovaginal exam following the transabdominal exam to visualize the uterus, endometrium, ovaries and adnexa . Color and duplex Doppler ultrasound was utilized to evaluate blood flow to the ovaries. COMPARISON:  None. FINDINGS: Uterus Measurements: 8.1 x 3.3 x 4.7 cm. No fibroids or other mass visualized. Endometrium Thickness: 2 mm in thickness.  No focal abnormality visualized. Right ovary Measurements: 2.9 x 1.9 x 2.1 cm. Normal appearance/no adnexal mass. Left ovary Measurements: 3.3 x 1.9 x 2.2 cm. Multiple small follicles. Normal appearance/no adnexal mass. Pulsed Doppler evaluation of both ovaries demonstrates normal low-resistance arterial and venous waveforms. Other findings Trace free fluid in the pelvis IMPRESSION: Unremarkable pelvic ultrasound. Electronically Signed   By: Charlett Nose M.D.   On: 08/08/2015 10:57   I have personally reviewed and evaluated these images and lab results as part of my medical decision-making.    MDM   Final diagnoses:  Cervicitis    0846  Labs reviewed.  UA is normal.  Repeat exam , pt has ttp in the suprapubic area.  Will proceed with pelvic exam.  Will consider CT vs [place] after pelvic exam. 0915  TTP left adnexal region.  Will [place] to evaluate for TOA, Torsion.  Sx may be related to PID  1154  [place] negative for torsion or abscess.  Doubt UTI or ureteral stone.  Doubt diverticulitis.Sx have improved.  Will treat for possible pid with her pelvic pain and left adnexal ttp.     Linwood Dibbles, MD 08/08/15 1155

## 2015-08-08 NOTE — Discharge Instructions (Signed)
Cervicitis °Cervicitis is a soreness and puffiness (inflammation) of the cervix.  °HOME CARE °· Do not have sex (intercourse) until your doctor says it is okay. °· Do not have sex until your partner is treated or as told by your doctor. °· Take your antibiotic medicine as told. Finish it even if you start to feel better. °GET HELP IF:  °· Your symptoms that brought you to the doctor come back. °· You have a fever. °MAKE SURE YOU:  °· Understand these instructions. °· Will watch your condition. °· Will get help right away if you are not doing well or get worse. °  °This information is not intended to replace advice given to you by your health care provider. Make sure you discuss any questions you have with your health care provider. °  °Document Released: 11/29/2007 Document Revised: 02/24/2013 Document Reviewed: 08/13/2012 °Elsevier Interactive Patient Education ©2016 [employer] ° °

## 2015-08-09 LAB — GC/CHLAMYDIA PROBE AMP (~~LOC~~) NOT AT ARMC
Chlamydia: NEGATIVE
Neisseria Gonorrhea: NEGATIVE

## 2015-08-09 LAB — HIV ANTIBODY (ROUTINE TESTING W REFLEX): HIV Screen 4th Generation wRfx: NONREACTIVE

## 2015-08-09 LAB — RPR: RPR Ser Ql: NONREACTIVE

## 2015-09-05 ENCOUNTER — Emergency Department (HOSPITAL_COMMUNITY): Payer: 59

## 2015-09-05 ENCOUNTER — Emergency Department (HOSPITAL_COMMUNITY)
Admission: EM | Admit: 2015-09-05 | Discharge: 2015-09-06 | Disposition: A | Discharge status: Home / Self Care | Payer: 59 | Source: Home / Self Care

## 2015-09-05 ENCOUNTER — Encounter (HOSPITAL_COMMUNITY): Payer: Self-pay | Admitting: Family Medicine

## 2015-09-05 DIAGNOSIS — R0602 Shortness of breath: Secondary | ICD-10-CM | POA: Diagnosis present

## 2015-09-05 DIAGNOSIS — R071 Chest pain on breathing: Secondary | ICD-10-CM

## 2015-09-05 DIAGNOSIS — R0789 Other chest pain: Secondary | ICD-10-CM | POA: Diagnosis not present

## 2015-09-05 DIAGNOSIS — Z791 Long term (current) use of non-steroidal anti-inflammatories (NSAID): Secondary | ICD-10-CM | POA: Diagnosis not present

## 2015-09-05 DIAGNOSIS — Z5321 Procedure and treatment not carried out due to patient leaving prior to being seen by health care provider: Secondary | ICD-10-CM | POA: Insufficient documentation

## 2015-09-05 DIAGNOSIS — F329 Major depressive disorder, single episode, unspecified: Secondary | ICD-10-CM | POA: Diagnosis not present

## 2015-09-05 DIAGNOSIS — F17210 Nicotine dependence, cigarettes, uncomplicated: Secondary | ICD-10-CM | POA: Diagnosis not present

## 2015-09-05 LAB — CBC
HCT: 43.8 % (ref 36.0–46.0)
HEMOGLOBIN: 14.8 g/dL (ref 12.0–15.0)
MCH: 34.3 pg — ABNORMAL HIGH (ref 26.0–34.0)
MCHC: 33.8 g/dL (ref 30.0–36.0)
MCV: 101.4 fL — ABNORMAL HIGH (ref 78.0–100.0)
Platelets: 320 10*3/uL (ref 150–400)
RBC: 4.32 MIL/uL (ref 3.87–5.11)
RDW: 12.6 % (ref 11.5–15.5)
WBC: 6.7 10*3/uL (ref 4.0–10.5)

## 2015-09-05 LAB — BASIC METABOLIC PANEL
ANION GAP: 10 (ref 5–15)
BUN: 13 mg/dL (ref 6–20)
CALCIUM: 8.8 mg/dL — AB (ref 8.9–10.3)
CO2: 22 mmol/L (ref 22–32)
CREATININE: 0.7 mg/dL (ref 0.44–1.00)
Chloride: 101 mmol/L (ref 101–111)
Glucose, Bld: 102 mg/dL — ABNORMAL HIGH (ref 65–99)
Potassium: 3.9 mmol/L (ref 3.5–5.1)
SODIUM: 133 mmol/L — AB (ref 135–145)

## 2015-09-05 LAB — I-STAT TROPONIN, ED: TROPONIN I, POC: 0 ng/mL (ref 0.00–0.08)

## 2015-09-05 NOTE — ED Notes (Signed)
Pt here for severe sharp stabbing left sided chest pain that is worse with breathing, moving and she feels like she cant catch a deep breath.

## 2015-09-05 NOTE — ED Notes (Signed)
Patient called for room 3X with no response 

## 2015-09-06 ENCOUNTER — Emergency Department (HOSPITAL_COMMUNITY)
Admission: EM | Admit: 2015-09-06 | Discharge: 2015-09-06 | Disposition: A | Discharge status: Home / Self Care | Payer: 59 | Attending: Emergency Medicine | Admitting: Emergency Medicine

## 2015-09-06 ENCOUNTER — Encounter (HOSPITAL_COMMUNITY): Payer: Self-pay | Admitting: Emergency Medicine

## 2015-09-06 ENCOUNTER — Emergency Department (HOSPITAL_COMMUNITY): Payer: 59

## 2015-09-06 DIAGNOSIS — R0789 Other chest pain: Secondary | ICD-10-CM

## 2015-09-06 DIAGNOSIS — F329 Major depressive disorder, single episode, unspecified: Secondary | ICD-10-CM | POA: Insufficient documentation

## 2015-09-06 DIAGNOSIS — F17210 Nicotine dependence, cigarettes, uncomplicated: Secondary | ICD-10-CM | POA: Insufficient documentation

## 2015-09-06 DIAGNOSIS — Z791 Long term (current) use of non-steroidal anti-inflammatories (NSAID): Secondary | ICD-10-CM | POA: Insufficient documentation

## 2015-09-06 MED ORDER — HYDROCODONE-ACETAMINOPHEN 5-325 MG PO TABS: 2.0000 | ORAL_TABLET | ORAL | Status: DC | PRN

## 2015-09-06 MED ORDER — KETOROLAC TROMETHAMINE 60 MG/2ML IM SOLN
60.0000 mg | Freq: Once | INTRAMUSCULAR | Status: AC
  Administered 2015-09-06: 60 mg via INTRAMUSCULAR
  Filled 2015-09-06: qty 2

## 2015-09-06 MED ORDER — NAPROXEN 500 MG PO TABS: 500.0000 mg | ORAL_TABLET | Freq: Two times a day (BID) | ORAL | Status: DC

## 2015-09-06 MED ORDER — METHOCARBAMOL 750 MG PO TABS: 750.0000 mg | ORAL_TABLET | Freq: Four times a day (QID) | ORAL | Status: DC

## 2015-09-06 NOTE — ED Provider Notes (Signed)
CSN: [id number]     Arrival date & time 09/06/15  1223 History   First MD Initiated Contact with Patient 09/06/15 1239     Chief Complaint  Patient presents with  . Shortness of Breath  . Chest Pain     (Consider location/radiation/quality/duration/timing/severity/associated sxs/prior Treatment) HPI Comments: Patient here with two-day history of persistent left-sided anterior chest pain characterized as sharp and worse with palpation or movement of her arm. Denies any associated diaphoresis nausea or vomiting. No recent fever or cough. No rashes to the skin. No history of trauma. Denies any leg pain or swelling. Pain is better with remaining still. Is using over-the-counter medications without relief. No recent history of travel or use of birth control pills  Patient is a 30 y.o. female presenting with shortness of breath and chest pain. The history is provided by the patient.  Shortness of Breath Associated symptoms: chest pain   Chest Pain Associated symptoms: shortness of breath     Past Medical History  Diagnosis Date  . Depression   . Anxiety   . Seizures (HCC)    History reviewed. No pertinent past surgical history. No family history on file. Social History  Substance Use Topics  . Smoking status: Current Every Day Smoker -- 0.50 packs/day    Types: Cigarettes  . Smokeless tobacco: None  . Alcohol Use: Yes     Comment: occasionally   OB History    No data available     Review of Systems  Respiratory: Positive for shortness of breath.   Cardiovascular: Positive for chest pain.  All other systems reviewed and are negative.     Allergies  Claritin  Home Medications   Prior to Admission medications   Medication Sig Start Date End Date Taking? Authorizing Provider  ibuprofen (ADVIL,MOTRIN) 200 MG tablet Take 200 mg by mouth every 6 (six) hours as needed for cramping.   Yes Historical Provider, MD  doxycycline (VIBRAMYCIN) 100 MG capsule Take 1 capsule (100 mg  total) by mouth 2 (two) times daily. Patient not taking: Reported on 09/06/2015 08/08/15   Linwood DibblesJon Knapp, MD  naproxen (NAPROSYN) 500 MG tablet Take 1 tablet (500 mg total) by mouth 2 (two) times daily with a meal. As needed for pain 08/08/15   Linwood DibblesJon Knapp, MD  ondansetron (ZOFRAN) 4 MG tablet Take 1 tablet (4 mg total) by mouth every 6 (six) hours. 08/08/15   Linwood DibblesJon Knapp, MD   BP 137/100 mmHg  Pulse 74  Temp(Src) 98.7 F (37.1 C) (Oral)  Resp 15  Ht 5\' 3"  (1.6 m)  Wt 58.968 kg  BMI 23.03 kg/m2  SpO2 98%  LMP 08/31/2015 Physical Exam  Constitutional: She is oriented to person, place, and time. She appears well-developed and well-nourished.  Non-toxic appearance. No distress.  HENT:  Head: Normocephalic and atraumatic.  Eyes: Conjunctivae, EOM and lids are normal. Pupils are equal, round, and reactive to light.  Neck: Normal range of motion. Neck supple. No tracheal deviation present. No thyroid mass present.  Cardiovascular: Normal rate, regular rhythm and normal heart sounds.  Exam reveals no gallop.   No murmur heard. Pulmonary/Chest: Effort normal and breath sounds normal. No stridor. No respiratory distress. She has no decreased breath sounds. She has no wheezes. She has no rhonchi. She has no rales.    Abdominal: Soft. Normal appearance and bowel sounds are normal. She exhibits no distension. There is no tenderness. There is no rebound and no CVA tenderness.  Musculoskeletal: Normal range of motion.  She exhibits no edema or tenderness.  Neurological: She is alert and oriented to person, place, and time. She has normal strength. No cranial nerve deficit or sensory deficit. GCS eye subscore is 4. GCS verbal subscore is 5. GCS motor subscore is 6.  Skin: Skin is warm and dry. No abrasion and no rash noted.  Psychiatric: She has a normal mood and affect. Her speech is normal and behavior is normal.  Nursing note and vitals reviewed.   ED Course  Procedures (including critical care time) Labs  Review Labs Reviewed - No data to display  Imaging Review Dg Chest 2 View  09/05/2015  CLINICAL DATA:  Left-sided chest pain for 1 week. Progressive worsening. EXAM: CHEST  2 VIEW COMPARISON:  None. FINDINGS: The cardiomediastinal contours are normal. The lungs are clear. Pulmonary vasculature is normal. No consolidation, pleural effusion, or pneumothorax. No acute osseous abnormalities are seen. IMPRESSION: No acute pulmonary process. Electronically Signed   By: Rubye OaksMelanie  Ehinger M.D.   On: 09/05/2015 20:06   I have personally reviewed and evaluated these images and lab results as part of my medical decision-making.   EKG Interpretation None      MDM   Final diagnoses:  None    Patient given Toradol here. Labs from her visit at [place] reviewed from yesterday. Suspect that she has costochondritis and doubt that this is a PE. She has perked negative. Return precautions given    Lorre NickAnthony Lucilla Petrenko, MD 09/06/15 [phone]

## 2015-09-06 NOTE — Discharge Instructions (Signed)

## 2015-09-06 NOTE — ED Notes (Signed)
Per patient, she is complaining of left sided chest pain that radiates to her left side.  [facility] had to leave due to family emergency.

## 2015-09-06 NOTE — ED Notes (Signed)
PT DISCHARGED. INSTRUCTIONS AND PRESCRIPTIONS GIVEN. AAOX4. PT IN NO APPARENT DISTRESS. THE OPPORTUNITY TO ASK QUESTIONS WAS PROVIDED. 

## 2015-09-06 NOTE — ED Notes (Signed)
Unable to collect labs at this time. 

## 2015-11-18 ENCOUNTER — Encounter: Payer: Self-pay | Admitting: Nurse Practitioner

## 2015-11-18 ENCOUNTER — Ambulatory Visit (INDEPENDENT_AMBULATORY_CARE_PROVIDER_SITE_OTHER): Payer: 59 | Admitting: Nurse Practitioner

## 2015-11-18 VITALS — BP 116/82 | HR 92 | Temp 98.6°F | Ht 63.0 in | Wt 124.0 lb

## 2015-11-18 DIAGNOSIS — R569 Unspecified convulsions: Secondary | ICD-10-CM

## 2015-11-18 DIAGNOSIS — F332 Major depressive disorder, recurrent severe without psychotic features: Secondary | ICD-10-CM

## 2015-11-18 DIAGNOSIS — F1010 Alcohol abuse, uncomplicated: Secondary | ICD-10-CM | POA: Diagnosis not present

## 2015-11-18 DIAGNOSIS — Z8659 Personal history of other mental and behavioral disorders: Secondary | ICD-10-CM

## 2015-11-18 DIAGNOSIS — Z23 Encounter for immunization: Secondary | ICD-10-CM

## 2015-11-18 LAB — POCT URINE PREGNANCY: Preg Test, Ur: NEGATIVE

## 2015-11-18 MED ORDER — NORGESTIM-ETH ESTRAD TRIPHASIC 0.18/0.215/0.25 MG-25 MCG PO TABS: 1.0000 | ORAL_TABLET | Freq: Every day | ORAL | 11 refills | Status: AC

## 2015-11-18 MED ORDER — OLANZAPINE-FLUOXETINE HCL 3-25 MG PO CAPS: 1.0000 | ORAL_CAPSULE | Freq: Every evening | ORAL | 1 refills | Status: DC

## 2015-11-18 NOTE — Progress Notes (Signed)
Subjective:    Patient ID: Tara Hensley, female    DOB: [birth year], 30 y.o.   MRN: [id number]  Patient presents today for complete physical or establish care (new patient).She would also like to discuss anxiety and depression management.  Anxiety  Presents for initial visit. Onset was 1 to 5 years ago. The problem has been waxing and waning. Symptoms include depressed mood, excessive worry, feeling of choking, hyperventilation, insomnia, irritability, muscle tension, palpitations, panic, restlessness and shortness of breath. Patient reports no chest pain, dizziness, nervous/anxious behavior or suicidal ideas. Symptoms occur constantly. The severity of symptoms is incapacitating, severe, causing significant distress and interfering with daily activities. Nothing aggravates the symptoms. Nighttime awakenings: one to two.   Risk factors include alcohol intake and family history. Her past medical history is significant for anxiety/panic attacks, bipolar disorder and depression. There is no history of suicide attempts. Past treatments include SSRIs, non-SSRI antidepressants, benzodiazephines and non-benzodiazephine anxiolytics. The treatment provided no relief. Compliance with prior treatments has been poor. Prior compliance problems include insurance issues and medication issues.  Diagnosed with bipolar by psychiatrist in [place]. Seroquel was prescribed but patient never filled prescription due to cost. Unable to tolerate these medications in past: Zoloft, Cymbalta, Klonopin, Celexa. Was able to tolerate prozac but stopped medication regardless. Last ED visit 05/2015: Was evaluated by psychiatrist, advised to continue Tegretol, also referred to [facility] dental health.she never followed up with [facility] and also discontinue Tegretol.  Seeing psychologist in [facility] this time.   Seizure: Denies any seizure activities.  Dr. Rachell Cipro, [place], [place] (previous pcp, last seen 2016).   Stopped  ETOH use 05/2015.  Immunizations: (TDAP, Hep C screen, Pneumovax, Influenza, zoster)  Health Maintenance  Topic Date Due  . Flu Shot  10/04/2015  . Pap Smear  04/05/2017  . Tetanus Vaccine  03/05/2024  . HIV Screening  Completed   Diet:None  Weight: 11/18/15 124 lb (56.2 kg)  09/06/15 130 lb (59 kg)  08/08/15 135 lb (61.2 kg)   Exercise:None  Pap Smear (every 54yr for >21-29 without HPV, every 563yrfor >30-6538yrith HPV): Up-to-date, last done 2016, normal per patient. Advanced Directive: Advanced Directives 09/06/2015  Does patient have an advance directive? No  Would patient like information on creating an advanced directive? No - patient declined information   Sexual History (birth control, marital status, STD): Sexually active with husband, no birth control at this time.  Medications and allergies reviewed with patient and updated if appropriate.  Patient Active Problem List   Diagnosis Date Noted  . Mild recurrent major depression ([place]CC[place]3/15/2017  . Alcohol abuse 04/26/2015  . Substance induced mood disorder ([place]CC[place]idd[place]2/21/2017  . Major depression (HCC[place]2/21/2017  . Alcohol withdrawal ([place]CC[place]ull [place]2/21/2017  . Self-inflicted injury 05/01/31/3545 Seizure ([place]2/20/2017    No current outpatient prescriptions on file prior to visit.   No current facility-administered medications on file prior to visit.     Past Medical History:  Diagnosis Date  . Anxiety   . Depression   . Seizures (HCC[place]3/2015 and 09/2013   2 episodes in lifetime, not evaluated by neurologist due to lack of insurance.  . Substance abuse     History reviewed. No pertinent surgical history.  Social History   Social History  . Marital status: Married    Spouse name: N/A  . Number of children: N/A  . Years of education: N/A   Social History Main Topics  . Smoking status: Current Every Day  Smoker    Packs/day: 0.50    Types: Cigarettes  . Smokeless tobacco: Current User  . Alcohol use Yes       Comment: occasionally  . Drug use: No  . Sexual activity: Yes    Birth control/ protection: None     Comment: married, no children   Other Topics Concern  . None   Social History Narrative  . None    Family History  Problem Relation Age of Onset  . Drug abuse Mother   . Hepatitis C Mother   . Mental illness Brother   . Diabetes Paternal Uncle   . Diabetes Paternal Grandmother   . Diabetes Paternal Grandfather   . Cancer Paternal Grandfather         Review of Systems  Constitutional: Positive for irritability. Negative for fever, malaise/fatigue and weight loss.  HENT: Negative for congestion and sore throat.   Eyes:       Negative for visual changes  Respiratory: Positive for shortness of breath. Negative for cough.   Cardiovascular: Positive for palpitations. Negative for chest pain and leg swelling.  Gastrointestinal: Negative for abdominal pain, blood in stool, constipation, diarrhea and heartburn.  Genitourinary: Negative for dysuria, frequency and urgency.  Musculoskeletal: Negative for falls, joint pain and myalgias.  Skin: Negative for rash.  Neurological: Negative for dizziness, sensory change, speech change and headaches.  Endo/Heme/Allergies: Does not bruise/bleed easily.  Psychiatric/Behavioral: Negative for depression, substance abuse and suicidal ideas. The patient has insomnia. The patient is not nervous/anxious.        Insomnia associated with anxiety and panic attacks   Recent Results (from the past 2160 hour(s))  Basic metabolic panel     Status: Abnormal   Collection Time: 09/05/15  7:11 PM  Result Value Ref Range   Sodium 133 (L) 135 - 145 mmol/L   Potassium 3.9 3.5 - 5.1 mmol/L   Chloride 101 101 - 111 mmol/L   CO2 22 22 - 32 mmol/L   Glucose, Bld 102 (H) 65 - 99 mg/dL   BUN 13 6 - 20 mg/dL   Creatinine, Ser 0.70 0.44 - 1.00 mg/dL   Calcium 8.8 (L) 8.9 - 10.3 mg/dL   GFR calc non Af Amer >60 >60 mL/min   GFR calc Af Amer >60 >60 mL/min     Comment: (NOTE) The eGFR has been calculated using the CKD EPI equation. This calculation has not been validated in all clinical situations. eGFR's persistently <60 mL/min signify possible Chronic Kidney Disease.    Anion gap 10 5 - 15  CBC     Status: Abnormal   Collection Time: 09/05/15  7:11 PM  Result Value Ref Range   WBC 6.7 4.0 - 10.5 K/uL   RBC 4.32 3.87 - 5.11 MIL/uL   Hemoglobin 14.8 12.0 - 15.0 g/dL   HCT 43.8 36.0 - 46.0 %   MCV 101.4 (H) 78.0 - 100.0 fL   MCH 34.3 (H) 26.0 - 34.0 pg   MCHC 33.8 30.0 - 36.0 g/dL   RDW 12.6 11.5 - 15.5 %   Platelets 320 150 - 400 K/uL  I-stat troponin, ED     Status: None   Collection Time: 09/05/15  7:31 PM  Result Value Ref Range   Troponin i, poc 0.00 0.00 - 0.08 ng/mL   Comment 3            Comment: Due to the release kinetics of cTnI, a negative result within the first hours of the onset  of symptoms does not rule out myocardial infarction with certainty. If myocardial infarction is still suspected, repeat the test at appropriate intervals.   POCT urine pregnancy     Status: Normal   Collection Time: 11/18/15  3:21 PM  Result Value Ref Range   Preg Test, Ur Negative Negative   Reviewed ED discharge and Behavioral Health summary from March and June 2017. Reviewed labs from [facility] encounter in March and June 2017.  Objective:   Vitals:   11/18/15 1357  BP: 116/82  Pulse: 92  Temp: 98.6 F (37 C)    Body mass index is 21.97 kg/m.   Physical Examination:  Physical Exam  Constitutional: She is oriented to person, place, and time and well-developed, well-nourished, and in no distress.  HENT:  Right Ear: External ear normal.  Left Ear: External ear normal.  Nose: Nose normal.  Mouth/Throat: No oropharyngeal exudate.  Eyes: Conjunctivae and EOM are normal. Pupils are equal, round, and reactive to light. No scleral icterus.  Neck: Normal range of motion. Neck supple. No thyromegaly present.  Cardiovascular:  Normal rate, regular rhythm, normal heart sounds and intact distal pulses.   Pulmonary/Chest: Effort normal and breath sounds normal. Right breast exhibits no mass, no nipple discharge and no skin change. Left breast exhibits no mass, no nipple discharge and no skin change. Breasts are symmetrical.  Abdominal: Soft. Bowel sounds are normal. She exhibits no distension. There is no tenderness.  Genitourinary: Rectum normal and vulva normal. Cervix exhibits no motion tenderness.  Musculoskeletal: Normal range of motion. She exhibits no edema or tenderness.  Lymphadenopathy:    She has no cervical adenopathy.  Neurological: She is alert and oriented to person, place, and time. Gait normal.  Skin: Skin is warm and dry.  Psychiatric:  Emotions are labile.  Vitals reviewed.   ASSESSMENT and PLAN:  [place] was seen today for new patient (initial visit).  Diagnoses and all orders for this visit:  Severe episode of recurrent major depressive disorder, without psychotic features ([place]) -     Norgestimate-Ethinyl Estradiol Triphasic 0.18/0.215/0.25 MG-25 MCG tab; Take 1 tablet by mouth daily. -     POCT urine pregnancy -     OLANZapine-FLUoxetine (SYMBYAX) 3-25 MG capsule; Take 1 capsule by mouth every evening. -     Ambulatory referral to Psychiatry  Hx of bipolar disorder -     Norgestimate-Ethinyl Estradiol Triphasic 0.18/0.215/0.25 MG-25 MCG tab; Take 1 tablet by mouth daily. -     POCT urine pregnancy -     OLANZapine-FLUoxetine (SYMBYAX) 3-25 MG capsule; Take 1 capsule by mouth every evening. -     Ambulatory referral to Psychiatry  Need for prophylactic vaccination and inoculation against influenza -     Flu Vaccine QUAD 36+ mos IM  Encounter for immunization -     Flu Vaccine QUAD 36+ mos IM  Alcohol abuse  Seizure ([place])    Major depression ([place]) Started Symbyax. Follow-up in one week. Ordered referral to psychiatry ([facility])  Alcohol abuse Denies any alcohol use since  March 2017.  Seizure [facility]) Denies any seizure activity since 2015    Follow up: Return in about 1 week (around 11/25/2015) for depression.  Wilfred Lacy, NP

## 2015-11-18 NOTE — Assessment & Plan Note (Signed)
Denies any seizure activity since 2015

## 2015-11-18 NOTE — Progress Notes (Signed)
Pre visit review using our clinic review tool, if applicable. No additional management support is needed unless otherwise documented below in the visit note. 

## 2015-11-18 NOTE — Patient Instructions (Signed)
Also discussed rare but serious side effect of suicide ideation.  She is instructed to discontinue medication go directly to ED if this occurs.  Pt verbalizes understanding.   Plan follow up in 1week to evaluate progress.

## 2015-11-18 NOTE — Assessment & Plan Note (Signed)
Denies any alcohol use since March 2017.

## 2015-11-18 NOTE — Progress Notes (Signed)
Reviewed with patient in office. See office note

## 2015-11-18 NOTE — Assessment & Plan Note (Signed)
Started Symbyax. Follow-up in one week. 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

## 2015-11-29 ENCOUNTER — Encounter: Payer: Self-pay | Admitting: Nurse Practitioner

## 2016-07-02 ENCOUNTER — Encounter: Payer: Self-pay | Admitting: Nurse Practitioner

## 2016-07-09 ENCOUNTER — Ambulatory Visit (INDEPENDENT_AMBULATORY_CARE_PROVIDER_SITE_OTHER): Payer: 59 | Admitting: Nurse Practitioner

## 2016-07-09 ENCOUNTER — Encounter: Payer: Self-pay | Admitting: Nurse Practitioner

## 2016-07-09 VITALS — BP 114/78 | HR 91 | Temp 97.0°F | Ht 63.0 in | Wt 126.0 lb

## 2016-07-09 DIAGNOSIS — J069 Acute upper respiratory infection, unspecified: Secondary | ICD-10-CM

## 2016-07-09 DIAGNOSIS — J209 Acute bronchitis, unspecified: Secondary | ICD-10-CM

## 2016-07-09 MED ORDER — ALBUTEROL SULFATE HFA 108 (90 BASE) MCG/ACT IN AERS: 2.0000 | INHALATION_SPRAY | Freq: Four times a day (QID) | RESPIRATORY_TRACT | 2 refills | Status: DC | PRN

## 2016-07-09 MED ORDER — BENZONATATE 100 MG PO CAPS: 100.0000 mg | ORAL_CAPSULE | Freq: Three times a day (TID) | ORAL | 0 refills | Status: AC | PRN

## 2016-07-09 MED ORDER — AMOXICILLIN-POT CLAVULANATE 875-125 MG PO TABS: 1.0000 | ORAL_TABLET | Freq: Two times a day (BID) | ORAL | 0 refills | Status: DC

## 2016-07-09 MED ORDER — OXYMETAZOLINE HCL 0.05 % NA SOLN: 1.0000 | Freq: Two times a day (BID) | NASAL | 0 refills | Status: AC

## 2016-07-09 MED ORDER — METHYLPREDNISOLONE ACETATE 80 MG/ML IJ SUSP
80.0000 mg | Freq: Once | INTRAMUSCULAR | Status: AC
  Administered 2016-07-09: 80 mg via INTRAMUSCULAR

## 2016-07-09 MED ORDER — BENZONATATE 100 MG PO CAPS: 100.0000 mg | ORAL_CAPSULE | Freq: Three times a day (TID) | ORAL | 0 refills | Status: DC | PRN

## 2016-07-09 MED ORDER — FLUTICASONE PROPIONATE 50 MCG/ACT NA SUSP: 2.0000 | Freq: Every day | NASAL | 0 refills | Status: AC

## 2016-07-09 MED ORDER — IPRATROPIUM-ALBUTEROL 0.5-2.5 (3) MG/3ML IN SOLN
3.0000 mL | Freq: Once | RESPIRATORY_TRACT | Status: AC
  Administered 2016-07-09: 3 mL via RESPIRATORY_TRACT

## 2016-07-09 MED ORDER — FLUTICASONE PROPIONATE 50 MCG/ACT NA SUSP: 2.0000 | Freq: Every day | NASAL | 0 refills | Status: DC

## 2016-07-09 MED ORDER — DM-GUAIFENESIN ER 30-600 MG PO TB12: 1.0000 | ORAL_TABLET | Freq: Two times a day (BID) | ORAL | 0 refills | Status: AC | PRN

## 2016-07-09 NOTE — Patient Instructions (Signed)

## 2016-07-09 NOTE — Progress Notes (Signed)
Dictation #1 [id number]  [id number]   Subjective:  Patient ID: Tara Hensley, female    DOB: November 03, 1985  Age: 31 y.o. MRN: [id number]  CC: Cough (coughing yellow mucus,fever,congestion,bodyache,no energy going on 3 days. dizzy--fall--hit left side face. )  ur   URI   This is a new problem. The current episode started in the past 7 days. The problem has been unchanged. The maximum temperature recorded prior to her arrival was 100.4 - 100.9 F. Associated symptoms include congestion, coughing, ear pain, headaches, a plugged ear sensation, rhinorrhea, sinus pain, sneezing, a sore throat, swollen glands and wheezing. Pertinent negatives include no chest pain or rash. She has tried acetaminophen, decongestant and increased fluids for the symptoms. The treatment provided no relief.   Outpatient Medications Prior to Visit  Medication Sig Dispense Refill  . Norgestimate-Ethinyl Estradiol Triphasic 0.18/0.215/0.25 MG-25 MCG tab Take 1 tablet by mouth daily. 1 Package 11  . OLANZapine-FLUoxetine (SYMBYAX) 3-25 MG capsule Take 1 capsule by mouth every evening. 30 capsule 1   No facility-administered medications prior to visit.     ROS See HPI  Objective:  BP 114/78   Pulse 91   Temp 97 F (36.1 C) Comment: axillary  Ht 5\' 3"  (1.6 m)   Wt 126 lb (57.2 kg)   SpO2 99%   BMI 22.32 kg/m   BP Readings from Last 3 Encounters:  07/09/16 114/78  11/18/15 116/82  09/06/15 121/76    Wt Readings from Last 3 Encounters:  07/09/16 126 lb (57.2 kg)  11/18/15 124 lb (56.2 kg)  09/06/15 130 lb (59 kg)    Physical Exam  Constitutional: She is oriented to person, place, and time.  HENT:  Right Ear: Tympanic membrane, external ear and ear canal normal.  Left Ear: Tympanic membrane, external ear and ear canal normal.  Nose: Mucosal edema and rhinorrhea present. Right sinus exhibits maxillary sinus tenderness. Right sinus exhibits no frontal sinus tenderness. Left sinus exhibits maxillary  sinus tenderness. Left sinus exhibits no frontal sinus tenderness.  Mouth/Throat: Uvula is midline. No trismus in the jaw. Posterior oropharyngeal erythema present. No oropharyngeal exudate.  Eyes: No scleral icterus.  Neck: Normal range of motion. Neck supple.  Cardiovascular: Normal rate and normal heart sounds.   Pulmonary/Chest: Effort normal. No respiratory distress. She has wheezes. She has no rales. She exhibits no tenderness.  Musculoskeletal: She exhibits no edema.  Lymphadenopathy:    She has cervical adenopathy.  Neurological: She is alert and oriented to person, place, and time.  Vitals reviewed.   Lab Results  Component Value Date   WBC 6.7 09/05/2015   HGB 14.8 09/05/2015   HCT 43.8 09/05/2015   PLT 320 09/05/2015   GLUCOSE 102 (H) 09/05/2015   ALT 13 (L) 08/08/2015   AST 24 08/08/2015   NA 133 (L) 09/05/2015   K 3.9 09/05/2015   CL 101 09/05/2015   CREATININE 0.70 09/05/2015   BUN 13 09/05/2015   CO2 22 09/05/2015    Dg Chest 2 View  Result Date: 09/06/2015 CLINICAL DATA:  Left-sided chest pain EXAM: CHEST  2 VIEW COMPARISON:  09/05/2015 FINDINGS: The heart size and mediastinal contours are within normal limits. Both lungs are clear. The visualized skeletal structures are unremarkable. IMPRESSION: No active cardiopulmonary disease. Electronically Signed   By: Signa Kell M.D.   On: 09/06/2015 13:03    Assessment & Plan:   Ida was seen today for cough.  Diagnoses and all orders for this visit:  Acute bronchitis, unspecified  organism -     ipratropium-albuterol (DUONEB) 0.5-2.5 (3) MG/3ML nebulizer solution 3 mL; Take 3 mLs by nebulization once. -     Discontinue: albuterol (PROVENTIL HFA;VENTOLIN HFA) 108 (90 Base) MCG/ACT inhaler; Inhale 2 puffs into the lungs every 6 (six) hours as needed for wheezing or shortness of breath. -     albuterol (PROVENTIL HFA;VENTOLIN HFA) 108 (90 Base) MCG/ACT inhaler; Inhale 2 puffs into the lungs every 6 (six) hours as  needed for wheezing or shortness of breath. -     methylPREDNISolone acetate (DEPO-MEDROL) injection 80 mg; Inject 1 mL (80 mg total) into the muscle once.  Acute URI -     ipratropium-albuterol (DUONEB) 0.5-2.5 (3) MG/3ML nebulizer solution 3 mL; Take 3 mLs by nebulization once. -     Discontinue: benzonatate (TESSALON) 100 MG capsule; Take 1 capsule (100 mg total) by mouth 3 (three) times daily as needed for cough. -     dextromethorphan-guaiFENesin (MUCINEX DM) 30-600 MG 12hr tablet; Take 1 tablet by mouth 2 (two) times daily as needed for cough. -     Discontinue: amoxicillin-clavulanate (AUGMENTIN) 875-125 MG tablet; Take 1 tablet by mouth 2 (two) times daily. -     Discontinue: fluticasone (FLONASE) 50 MCG/ACT nasal spray; Place 2 sprays into both nostrils daily. -     oxymetazoline (AFRIN NASAL SPRAY) 0.05 % nasal spray; Place 1 spray into both nostrils 2 (two) times daily. Use only for 3days, then stop -     fluticasone (FLONASE) 50 MCG/ACT nasal spray; Place 2 sprays into both nostrils daily. -     benzonatate (TESSALON) 100 MG capsule; Take 1 capsule (100 mg total) by mouth 3 (three) times daily as needed for cough. -     amoxicillin-clavulanate (AUGMENTIN) 875-125 MG tablet; Take 1 tablet by mouth 2 (two) times daily. -     methylPREDNISolone acetate (DEPO-MEDROL) injection 80 mg; Inject 1 mL (80 mg total) into the muscle once.   I have discontinued Ms. Farrey's OLANZapine-FLUoxetine. I am also having her start on dextromethorphan-guaiFENesin and oxymetazoline. Additionally, I am having her maintain her Norgestimate-Ethinyl Estradiol Triphasic, fluticasone, benzonatate, amoxicillin-clavulanate, and albuterol. We administered ipratropium-albuterol and methylPREDNISolone acetate.  Meds ordered this encounter  Medications  . ipratropium-albuterol (DUONEB) 0.5-2.5 (3) MG/3ML nebulizer solution 3 mL  . DISCONTD: benzonatate (TESSALON) 100 MG capsule    Sig: Take 1 capsule (100 mg total)  by mouth 3 (three) times daily as needed for cough.    Dispense:  20 capsule    Refill:  0    Order Specific Question:   Supervising Provider    Answer:   Tresa GarterPLOTNIKOV, ALEKSEI V [[id number]]  . dextromethorphan-guaiFENesin (MUCINEX DM) 30-600 MG 12hr tablet    Sig: Take 1 tablet by mouth 2 (two) times daily as needed for cough.    Dispense:  14 tablet    Refill:  0    Order Specific Question:   Supervising Provider    Answer:   Tresa GarterPLOTNIKOV, ALEKSEI V [[id number]]  . DISCONTD: amoxicillin-clavulanate (AUGMENTIN) 875-125 MG tablet    Sig: Take 1 tablet by mouth 2 (two) times daily.    Dispense:  14 tablet    Refill:  0    Order Specific Question:   Supervising Provider    Answer:   Tresa GarterPLOTNIKOV, ALEKSEI V [[id number]]  . DISCONTD: fluticasone (FLONASE) 50 MCG/ACT nasal spray    Sig: Place 2 sprays into both nostrils daily.    Dispense:  16 g    Refill:  0  Order Specific Question:   Supervising Provider    Answer:   Tresa Garter [[id number]]  . oxymetazoline (AFRIN NASAL SPRAY) 0.05 % nasal spray    Sig: Place 1 spray into both nostrils 2 (two) times daily. Use only for 3days, then stop    Dispense:  30 mL    Refill:  0    Order Specific Question:   Supervising Provider    Answer:   Tresa Garter [[id number]]  . DISCONTD: albuterol (PROVENTIL HFA;VENTOLIN HFA) 108 (90 Base) MCG/ACT inhaler    Sig: Inhale 2 puffs into the lungs every 6 (six) hours as needed for wheezing or shortness of breath.    Dispense:  1 Inhaler    Refill:  2    Order Specific Question:   Supervising Provider    Answer:   Tresa Garter [[id number]]  . fluticasone (FLONASE) 50 MCG/ACT nasal spray    Sig: Place 2 sprays into both nostrils daily.    Dispense:  16 g    Refill:  0    Order Specific Question:   Supervising Provider    Answer:   Tresa Garter [[id number]]  . benzonatate (TESSALON) 100 MG capsule    Sig: Take 1 capsule (100 mg total) by mouth 3 (three) times daily as needed for cough.    Dispense:  20 capsule     Refill:  0    Order Specific Question:   Supervising Provider    Answer:   Tresa Garter [[id number]]  . amoxicillin-clavulanate (AUGMENTIN) 875-125 MG tablet    Sig: Take 1 tablet by mouth 2 (two) times daily.    Dispense:  14 tablet    Refill:  0    Order Specific Question:   Supervising Provider    Answer:   Tresa Garter [[id number]]  . albuterol (PROVENTIL HFA;VENTOLIN HFA) 108 (90 Base) MCG/ACT inhaler    Sig: Inhale 2 puffs into the lungs every 6 (six) hours as needed for wheezing or shortness of breath.    Dispense:  1 Inhaler    Refill:  2    Order Specific Question:   Supervising Provider    Answer:   Tresa Garter [[id number]]  . methylPREDNISolone acetate (DEPO-MEDROL) injection 80 mg    Follow-up: Return if symptoms worsen or fail to improve.  Alysia Penna, NP

## 2016-07-09 NOTE — Progress Notes (Signed)
Pre visit review using our clinic review tool, if applicable. No additional management support is needed unless otherwise documented below in the visit note. 

## 2016-07-11 ENCOUNTER — Emergency Department (HOSPITAL_COMMUNITY): Payer: 59

## 2016-07-11 ENCOUNTER — Emergency Department (EMERGENCY_DEPARTMENT_HOSPITAL)
Admission: EM | Admit: 2016-07-11 | Discharge: 2016-07-11 | Disposition: A | Discharge status: Home / Self Care | Payer: 59 | Source: Home / Self Care | Attending: Emergency Medicine | Admitting: Emergency Medicine

## 2016-07-11 ENCOUNTER — Encounter (HOSPITAL_COMMUNITY): Payer: Self-pay | Admitting: Emergency Medicine

## 2016-07-11 DIAGNOSIS — T1491XA Suicide attempt, initial encounter: Secondary | ICD-10-CM

## 2016-07-11 DIAGNOSIS — T426X2A Poisoning by other antiepileptic and sedative-hypnotic drugs, intentional self-harm, initial encounter: Secondary | ICD-10-CM

## 2016-07-11 DIAGNOSIS — T424X2A Poisoning by benzodiazepines, intentional self-harm, initial encounter: Secondary | ICD-10-CM | POA: Insufficient documentation

## 2016-07-11 DIAGNOSIS — F419 Anxiety disorder, unspecified: Secondary | ICD-10-CM | POA: Insufficient documentation

## 2016-07-11 DIAGNOSIS — W228XXA Striking against or struck by other objects, initial encounter: Secondary | ICD-10-CM | POA: Insufficient documentation

## 2016-07-11 DIAGNOSIS — Z79899 Other long term (current) drug therapy: Secondary | ICD-10-CM

## 2016-07-11 DIAGNOSIS — F324 Major depressive disorder, single episode, in partial remission: Secondary | ICD-10-CM | POA: Diagnosis not present

## 2016-07-11 DIAGNOSIS — Y929 Unspecified place or not applicable: Secondary | ICD-10-CM

## 2016-07-11 DIAGNOSIS — Y999 Unspecified external cause status: Secondary | ICD-10-CM

## 2016-07-11 DIAGNOSIS — Z813 Family history of other psychoactive substance abuse and dependence: Secondary | ICD-10-CM

## 2016-07-11 DIAGNOSIS — S0012XA Contusion of left eyelid and periocular area, initial encounter: Secondary | ICD-10-CM

## 2016-07-11 DIAGNOSIS — F17210 Nicotine dependence, cigarettes, uncomplicated: Secondary | ICD-10-CM

## 2016-07-11 DIAGNOSIS — T50902A Poisoning by unspecified drugs, medicaments and biological substances, intentional self-harm, initial encounter: Secondary | ICD-10-CM

## 2016-07-11 DIAGNOSIS — F10929 Alcohol use, unspecified with intoxication, unspecified: Secondary | ICD-10-CM

## 2016-07-11 DIAGNOSIS — G92 Toxic encephalopathy: Secondary | ICD-10-CM | POA: Diagnosis not present

## 2016-07-11 DIAGNOSIS — F10129 Alcohol abuse with intoxication, unspecified: Secondary | ICD-10-CM | POA: Diagnosis not present

## 2016-07-11 DIAGNOSIS — Z793 Long term (current) use of hormonal contraceptives: Secondary | ICD-10-CM | POA: Insufficient documentation

## 2016-07-11 DIAGNOSIS — T510X2A Toxic effect of ethanol, intentional self-harm, initial encounter: Secondary | ICD-10-CM | POA: Diagnosis not present

## 2016-07-11 DIAGNOSIS — F10120 Alcohol abuse with intoxication, uncomplicated: Secondary | ICD-10-CM | POA: Insufficient documentation

## 2016-07-11 DIAGNOSIS — Y939 Activity, unspecified: Secondary | ICD-10-CM | POA: Insufficient documentation

## 2016-07-11 DIAGNOSIS — F329 Major depressive disorder, single episode, unspecified: Secondary | ICD-10-CM | POA: Diagnosis present

## 2016-07-11 LAB — BASIC METABOLIC PANEL
ANION GAP: 9 (ref 5–15)
BUN: 13 mg/dL (ref 6–20)
CO2: 25 mmol/L (ref 22–32)
Calcium: 8.6 mg/dL — ABNORMAL LOW (ref 8.9–10.3)
Chloride: 106 mmol/L (ref 101–111)
Creatinine, Ser: 0.75 mg/dL (ref 0.44–1.00)
GFR calc Af Amer: >60 mL/min (ref >60)
GFR calc non Af Amer: >60 mL/min (ref >60)
GLUCOSE: 88 mg/dL (ref 65–99)
POTASSIUM: 3.9 mmol/L (ref 3.5–5.1)
Sodium: 140 mmol/L (ref 135–145)

## 2016-07-11 LAB — CBC WITH DIFFERENTIAL/PLATELET
BASOS ABS: 0 10*3/uL (ref 0.0–0.1)
Basophils Relative: 0 %
Eosinophils Absolute: 0 10*3/uL (ref 0.0–0.7)
Eosinophils Relative: 0 %
HEMATOCRIT: 41 % (ref 36.0–46.0)
HEMOGLOBIN: 13.9 g/dL (ref 12.0–15.0)
LYMPHS PCT: 17 %
Lymphs Abs: 1.2 10*3/uL (ref 0.7–4.0)
MCH: 34.9 pg — ABNORMAL HIGH (ref 26.0–34.0)
MCHC: 33.9 g/dL (ref 30.0–36.0)
MCV: 103 fL — AB (ref 78.0–100.0)
MONO ABS: 0.6 10*3/uL (ref 0.1–1.0)
Monocytes Relative: 9 %
NEUTROS ABS: 5.2 10*3/uL (ref 1.7–7.7)
Neutrophils Relative %: 74 %
Platelets: 241 10*3/uL (ref 150–400)
RBC: 3.98 MIL/uL (ref 3.87–5.11)
RDW: 14 % (ref 11.5–15.5)
WBC: 7 10*3/uL (ref 4.0–10.5)

## 2016-07-11 LAB — RAPID URINE DRUG SCREEN, HOSP PERFORMED
AMPHETAMINES: NOT DETECTED
Barbiturates: NOT DETECTED
Benzodiazepines: POSITIVE — AB
COCAINE: NOT DETECTED
OPIATES: NOT DETECTED
TETRAHYDROCANNABINOL: POSITIVE — AB

## 2016-07-11 LAB — SALICYLATE LEVEL: Salicylate Lvl: <7 mg/dL (ref 2.8–30.0)

## 2016-07-11 LAB — ETHANOL: Alcohol, Ethyl (B): 127 mg/dL — ABNORMAL HIGH (ref <5)

## 2016-07-11 LAB — I-STAT BETA HCG BLOOD, ED (MC, WL, AP ONLY): I-stat hCG, quantitative: <5 m[IU]/mL (ref <5)

## 2016-07-11 LAB — ACETAMINOPHEN LEVEL

## 2016-07-11 MED ORDER — ALBUTEROL SULFATE HFA 108 (90 BASE) MCG/ACT IN AERS
2.0000 | INHALATION_SPRAY | Freq: Four times a day (QID) | RESPIRATORY_TRACT | Status: DC | PRN
  Administered 2016-07-11: 2 via RESPIRATORY_TRACT
  Filled 2016-07-11: qty 6.7

## 2016-07-11 MED ORDER — NICOTINE 21 MG/24HR TD PT24: 21.0000 mg | MEDICATED_PATCH | Freq: Every day | TRANSDERMAL | Status: DC

## 2016-07-11 MED ORDER — ALPRAZOLAM 0.5 MG PO TABS
1.0000 mg | ORAL_TABLET | Freq: Every day | ORAL | Status: DC | PRN
  Administered 2016-07-11: 1 mg via ORAL
  Filled 2016-07-11: qty 2

## 2016-07-11 MED ORDER — OXYMETAZOLINE HCL 0.05 % NA SOLN: 1.0000 | Freq: Two times a day (BID) | NASAL | Status: DC

## 2016-07-11 MED ORDER — AMOXICILLIN-POT CLAVULANATE 875-125 MG PO TABS
1.0000 | ORAL_TABLET | Freq: Two times a day (BID) | ORAL | Status: DC
  Administered 2016-07-11: 1 via ORAL
  Filled 2016-07-11: qty 1

## 2016-07-11 MED ORDER — ONDANSETRON HCL 4 MG PO TABS: 4.0000 mg | ORAL_TABLET | Freq: Three times a day (TID) | ORAL | Status: DC | PRN

## 2016-07-11 MED ORDER — LORAZEPAM 1 MG PO TABS: 0.0000 mg | ORAL_TABLET | Freq: Two times a day (BID) | ORAL | Status: DC

## 2016-07-11 MED ORDER — IBUPROFEN 200 MG PO TABS: 600.0000 mg | ORAL_TABLET | Freq: Three times a day (TID) | ORAL | Status: DC | PRN

## 2016-07-11 MED ORDER — FLUTICASONE PROPIONATE 50 MCG/ACT NA SUSP
2.0000 | Freq: Every day | NASAL | Status: DC
  Administered 2016-07-11: 2 via NASAL
  Filled 2016-07-11: qty 16

## 2016-07-11 MED ORDER — ACETAMINOPHEN 325 MG PO TABS: 650.0000 mg | ORAL_TABLET | ORAL | Status: DC | PRN

## 2016-07-11 MED ORDER — SODIUM CHLORIDE 0.9 % IV BOLUS (SEPSIS)
1000.0000 mL | Freq: Once | INTRAVENOUS | Status: AC
  Administered 2016-07-11: 1000 mL via INTRAVENOUS

## 2016-07-11 MED ORDER — BENZONATATE 100 MG PO CAPS
100.0000 mg | ORAL_CAPSULE | Freq: Three times a day (TID) | ORAL | Status: DC | PRN
  Administered 2016-07-11: 100 mg via ORAL
  Filled 2016-07-11: qty 1

## 2016-07-11 MED ORDER — LORAZEPAM 1 MG PO TABS: 0.0000 mg | ORAL_TABLET | Freq: Four times a day (QID) | ORAL | Status: DC

## 2016-07-11 NOTE — ED Notes (Signed)
Bed: WA05 Expected date:  Expected time:  Means of arrival:  Comments: 

## 2016-07-11 NOTE — Progress Notes (Signed)
Per Karleen HampshireSpencer, PA recommend a.m. Psych evaluation Keslie Gritz K. Sherlon HandingHarris, LCAS-A, LPC-A, [facility]  Counselor 07/11/2016 4:22 AM

## 2016-07-11 NOTE — Discharge Instructions (Signed)
For your ongoing behavioral health needs, you are advised to continue treatment at [facility]:       [facility]      [address]      [place] [address]      [phone]

## 2016-07-11 NOTE — BH Assessment (Signed)
[facility] Assessment Progress Note  Per Thedore MinsMojeed Akintayo, MD, this pt does not require psychiatric hospitalization at this time.  [facility] [place].  This has been included in pt's discharge instructions.  Pt's nurse has been notified.  Doylene Canninghomas Nyzir Dubois, MA Triage Specialist [phone]

## 2016-07-11 NOTE — Progress Notes (Signed)
During assessment pt did request a chaplain consult/visit for grief/support. Genine Beckett K. Sherlon HandingHarris, LCAS-A, LPC-A, [facility]  Counselor 07/11/2016 4:22 AM

## 2016-07-11 NOTE — ED Notes (Signed)
Bed: ZO10WA16 Expected date:  Expected time:  Means of arrival:  Comments: 31 yr old etoh vomiting

## 2016-07-11 NOTE — Consult Note (Signed)
[place] Psychiatry Consult   Reason for Consult:  Overdose on Xanax and Lamictal  Referring Physician:  EDP Patient Identification: Tara Hensley MRN:  [id number] Principal Diagnosis: Major depression Diagnosis:   Patient Active Problem List   Diagnosis Date Noted  . Major depression [F32.9] 04/26/2015    Priority: High  . 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 Alcohol abuse [F10.10] 04/26/2015  . Substance induced mood disorder ([place]) [F19.94] 04/26/2015  . Alcohol withdrawal ([place]) [F10.239] 04/26/2015  . Self-inflicted injury [N47.09] 04/26/2015  . Seizure ([place]) [R56.9] 04/25/2015    Total Time spent with patient: 30 minutes  Subjective:   Tara Hensley is a 31 y.o. female patient admitted with overdose of her Xanax and Lamictal.  HPI:  Patient was admitted after she took an overdose of Lamictal and Xanax.  Both meds are prescribed by Dr Jeanette Caprice at [facility].  Patient states, "I'm grown, I know that I did the wrong thing, after the overdose, I called someone to get help, you can't keep me here, I have a job.  I have outpatient I go to.  I'm on medication."    Patient has a history of anxiety, depression and Bipolar disorder, seizures.  Patient adamantly denies SI and HI.  She reports that her mother had passed away "suddenly"  after long history of substance abuse and she is still dealing with grief.  She would like to be discharged and continue her follow up with [facility].  To note, patient had taken Keppra in the past for seizure disorder.    Past Psychiatric History: see HPI  Risk to Self: Suicidal Ideation: No Suicidal Intent: No Is patient at risk for suicide?: Yes Suicidal Plan?: No Access to Means: Yes Specify Access to Suicidal Means: access to pills What has been your use of drugs/alcohol within the last 12 months?: alcohol, severe How many times?: 0 Other Self Harm Risks: none Triggers for Past  Attempts: Unknown Intentional Self Injurious Behavior: None Risk to Others: Homicidal Ideation: No Thoughts of Harm to Others: No Current Homicidal Intent: No Current Homicidal Plan: No Access to Homicidal Means: No Identified Victim: none History of harm to others?: No Assessment of Violence: None Noted Violent Behavior Description: n/a Does patient have access to weapons?: No Criminal Charges Pending?: No Does patient have a court date: No Prior Inpatient Therapy: Prior Inpatient Therapy: Yes Prior Therapy Dates: 2016 Prior Therapy Facilty/Provider(s): [facility] Reason for Treatment: depression Prior Outpatient Therapy: Prior Outpatient Therapy: Yes Prior Therapy Dates: current Prior Therapy Facilty/Provider(s): [facility] Reason for Treatment: Depression, SA Does patient have an ACCT team?: No Does patient have Intensive In-House Services?  : No Does patient have [facility] services? : No Does patient have P4CC services?: No  Past Medical History:  Past Medical History:  Diagnosis Date  . Anxiety   . Depression   . Seizures ([place]) 05/2013 and 09/2013   2 episodes in lifetime, not evaluated by neurologist due to lack of insurance.  . Substance abuse    History reviewed. No pertinent surgical history. Family History:  Family History  Problem Relation Age of Onset  . Drug abuse Mother   . Hepatitis C Mother   . Mental illness Brother   . Diabetes Paternal Uncle   . Diabetes Paternal Grandmother   . Diabetes Paternal Grandfather   . Cancer Paternal Grandfather    Family Psychiatric  History: see HPI Social History:  History  Alcohol  Use  . Yes    Comment: occasionally     History  Drug Use No    Social History   Social History  . Marital status: Married    Spouse name: N/A  . Number of children: N/A  . Years of education: N/A   Social History Main Topics  . Smoking status: Current Every Day Smoker    Packs/day: 0.50    Types: Cigarettes  .  Smokeless tobacco: Current User  . Alcohol use Yes     Comment: occasionally  . Drug use: No  . Sexual activity: Yes    Birth control/ protection: None     Comment: married, no children   Other Topics Concern  . None   Social History Narrative  . None   Additional Social History:    Allergies:   Allergies  Allergen Reactions  . Claritin [Loratadine] Hives    Labs:  Results for orders placed or performed during the hospital encounter of 07/11/16 (from the past 48 hour(s))  CBC with Differential     Status: Abnormal   Collection Time: 07/11/16 12:44 AM  Result Value Ref Range   WBC 7.0 4.0 - 10.5 K/uL   RBC 3.98 3.87 - 5.11 MIL/uL   Hemoglobin 13.9 12.0 - 15.0 g/dL   HCT 41.0 36.0 - 46.0 %   MCV 103.0 (H) 78.0 - 100.0 fL   MCH 34.9 (H) 26.0 - 34.0 pg   MCHC 33.9 30.0 - 36.0 g/dL   RDW 14.0 11.5 - 15.5 %   Platelets 241 150 - 400 K/uL   Neutrophils Relative % 74 %   Neutro Abs 5.2 1.7 - 7.7 K/uL   Lymphocytes Relative 17 %   Lymphs Abs 1.2 0.7 - 4.0 K/uL   Monocytes Relative 9 %   Monocytes Absolute 0.6 0.1 - 1.0 K/uL   Eosinophils Relative 0 %   Eosinophils Absolute 0.0 0.0 - 0.7 K/uL   Basophils Relative 0 %   Basophils Absolute 0.0 0.0 - 0.1 K/uL  Basic metabolic panel     Status: Abnormal   Collection Time: 07/11/16 12:44 AM  Result Value Ref Range   Sodium 140 135 - 145 mmol/L   Potassium 3.9 3.5 - 5.1 mmol/L   Chloride 106 101 - 111 mmol/L   CO2 25 22 - 32 mmol/L   Glucose, Bld 88 65 - 99 mg/dL   BUN 13 6 - 20 mg/dL   Creatinine, Ser 0.75 0.44 - 1.00 mg/dL   Calcium 8.6 (L) 8.9 - 10.3 mg/dL   GFR calc non Af Amer >60 >60 mL/min   GFR calc Af Amer >60 >60 mL/min    Comment: (NOTE) The eGFR has been calculated using the CKD EPI equation. This calculation has not been validated in all clinical situations. eGFR's persistently <60 mL/min signify possible Chronic Kidney Disease.    Anion gap 9 5 - 15  Ethanol     Status: Abnormal   Collection Time:  07/11/16 12:44 AM  Result Value Ref Range   Alcohol, Ethyl (B) 127 (H) <5 mg/dL    Comment:        LOWEST DETECTABLE LIMIT FOR SERUM ALCOHOL IS 5 mg/dL FOR MEDICAL PURPOSES ONLY   Salicylate level     Status: None   Collection Time: 07/11/16 12:44 AM  Result Value Ref Range   Salicylate Lvl <4.6 2.8 - 30.0 mg/dL  Acetaminophen level     Status: Abnormal   Collection Time: 07/11/16 12:44 AM  Result Value Ref Range   Acetaminophen (Tylenol), Serum <10 (L) 10 - 30 ug/mL    Comment:        THERAPEUTIC CONCENTRATIONS VARY SIGNIFICANTLY. A RANGE OF 10-30 ug/mL MAY BE AN EFFECTIVE CONCENTRATION FOR MANY PATIENTS. HOWEVER, SOME ARE BEST TREATED AT CONCENTRATIONS OUTSIDE THIS RANGE. ACETAMINOPHEN CONCENTRATIONS >150 ug/mL AT 4 HOURS AFTER INGESTION AND >50 ug/mL AT 12 HOURS AFTER INGESTION ARE OFTEN ASSOCIATED WITH TOXIC REACTIONS.   I-Stat Beta hCG blood, ED ([facility], [facility], [facility] only)     Status: None   Collection Time: 07/11/16  1:13 AM  Result Value Ref Range   I-stat hCG, quantitative <5.0 <5 mIU/mL   Comment 3            Comment:   GEST. AGE      CONC.  (mIU/mL)   <=1 WEEK        5 - 50     2 WEEKS       50 - 500     3 WEEKS       100 - 10,000     4 WEEKS     1,000 - 30,000        FEMALE AND NON-PREGNANT FEMALE:     LESS THAN 5 mIU/mL   Rapid urine drug screen (hospital performed)     Status: Abnormal   Collection Time: 07/11/16  6:27 AM  Result Value Ref Range   Opiates NONE DETECTED NONE DETECTED   Cocaine NONE DETECTED NONE DETECTED   Benzodiazepines POSITIVE (A) NONE DETECTED   Amphetamines NONE DETECTED NONE DETECTED   Tetrahydrocannabinol POSITIVE (A) NONE DETECTED   Barbiturates NONE DETECTED NONE DETECTED    Comment:        DRUG SCREEN FOR MEDICAL PURPOSES ONLY.  IF CONFIRMATION IS NEEDED FOR ANY PURPOSE, NOTIFY LAB WITHIN 5 DAYS.        LOWEST DETECTABLE LIMITS FOR URINE DRUG SCREEN Drug Class       Cutoff (ng/mL) Amphetamine      1000 Barbiturate       200 Benzodiazepine   034 Tricyclics       742 Opiates          300 Cocaine          300 THC              50     Current Facility-Administered Medications  Medication Dose Route Frequency Provider Last Rate Last Dose  . acetaminophen (TYLENOL) tablet 650 mg  650 mg Oral Q4H PRN Sherwood Gambler, MD      . albuterol (PROVENTIL HFA;VENTOLIN HFA) 108 (90 Base) MCG/ACT inhaler 2 puff  2 puff Inhalation Q6H PRN Sherwood Gambler, MD   2 puff at 07/11/16 0707  . ALPRAZolam Duanne Moron) tablet 1 mg  1 mg Oral Daily PRN Sherwood Gambler, MD   1 mg at 07/11/16 0645  . amoxicillin-clavulanate (AUGMENTIN) 875-125 MG per tablet 1 tablet  1 tablet Oral BID Sherwood Gambler, MD   1 tablet at 07/11/16 1044  . benzonatate (TESSALON) capsule 100 mg  100 mg Oral TID PRN Sherwood Gambler, MD   100 mg at 07/11/16 0707  . fluticasone (FLONASE) 50 MCG/ACT nasal spray 2 spray  2 spray Each Nare Daily Sherwood Gambler, MD   2 spray at 07/11/16 1044  . ibuprofen (ADVIL,MOTRIN) tablet 600 mg  600 mg Oral Q8H PRN Sherwood Gambler, MD      . LORazepam (ATIVAN) tablet 0-4 mg  0-4 mg Oral Q6H  Sherwood Gambler, MD       Followed by  . [START ON 07/13/2016] LORazepam (ATIVAN) tablet 0-4 mg  0-4 mg Oral Q12H Sherwood Gambler, MD      . nicotine (NICODERM CQ - dosed in mg/24 hours) patch 21 mg  21 mg Transdermal Daily Sherwood Gambler, MD      . ondansetron [facility]) tablet 4 mg  4 mg Oral Q8H PRN Sherwood Gambler, MD      . oxymetazoline (AFRIN) 0.05 % nasal spray 1 spray  1 spray Each Nare BID Sherwood Gambler, MD       Current Outpatient Prescriptions  Medication Sig Dispense Refill  . albuterol (PROVENTIL HFA;VENTOLIN HFA) 108 (90 Base) MCG/ACT inhaler Inhale 2 puffs into the lungs every 6 (six) hours as needed for wheezing or shortness of breath. 1 Inhaler 2  . ALPRAZolam (XANAX) 1 MG tablet Take 1 tablet by mouth daily as needed for anxiety.     Marland Kitchen amoxicillin-clavulanate (AUGMENTIN) 875-125 MG tablet Take 1 tablet by mouth 2 (two)  times daily. 14 tablet 0  . benzonatate (TESSALON) 100 MG capsule Take 1 capsule (100 mg total) by mouth 3 (three) times daily as needed for cough. 20 capsule 0  . dextromethorphan-guaiFENesin (MUCINEX DM) 30-600 MG 12hr tablet Take 1 tablet by mouth 2 (two) times daily as needed for cough. (Patient not taking: Reported on 07/11/2016) 14 tablet 0  . fluticasone (FLONASE) 50 MCG/ACT nasal spray Place 2 sprays into both nostrils daily. 16 g 0  . Norgestimate-Ethinyl Estradiol Triphasic 0.18/0.215/0.25 MG-25 MCG tab Take 1 tablet by mouth daily. (Patient not taking: Reported on 07/11/2016) 1 Package 11  . oxymetazoline (AFRIN NASAL SPRAY) 0.05 % nasal spray Place 1 spray into both nostrils 2 (two) times daily. Use only for 3days, then stop 30 mL 0    Musculoskeletal: Strength & Muscle Tone: within normal limits Gait & Station: normal Patient leans: N/A  Psychiatric Specialty Exam: Physical Exam  Nursing note and vitals reviewed.   ROS  Blood pressure 122/83, pulse 91, temperature 98.2 F (36.8 C), temperature source Oral, resp. rate 18, last menstrual period 07/06/2016, SpO2 99 %.There is no height or weight on file to calculate BMI.  General Appearance: Neat  Eye Contact:  Fair  Speech:  Clear and Coherent  Volume:  Normal  Mood:  Euthymic  Affect:  Congruent  Thought Process:  Coherent  Orientation:  Full (Time, Place, and Person)  Thought Content:  Logical  Suicidal Thoughts:  No Denies  Homicidal Thoughts:  No denies  Memory:  Immediate;   Fair Recent;   Fair Remote;   Fair  Judgement:  Fair  Insight:  Fair  Psychomotor Activity:  Normal  Concentration:  Concentration: Fair and Attention Span: Fair  Recall:  Good  Fund of Knowledge:  Good  Language:  Good  Akathisia:  No  Handed:  Right  AIMS (if indicated):     Assets:  Resilience  ADL's:  Intact  Cognition:  WNL  Sleep:  6 hrs   Treatment Plan Summary: Daily contact with patient to assess and evaluate symptoms and  progress in treatment, Medication management and Plan discharge to home, self care  Disposition: No evidence of imminent risk to self or others at present.   Patient does not meet criteria for psychiatric inpatient admission. Discussed crisis plan, support from social network, calling 911, coming to the Emergency Department, and calling Suicide Hotline. will follow up with [place] [facility] [place],  NP [facility] 07/11/2016 11:35 AM  Patient seen face-to-face for psychiatric evaluation, chart reviewed and case discussed with the physician extender and developed treatment plan. Reviewed the information documented and agree with the treatment plan. Corena Pilgrim, MD

## 2016-07-11 NOTE — ED Notes (Signed)
Poison control contacted due to ingestion. Advised to monitor for tachycardia and hypertension. Also advised to keep pt under observation for 6 hours post ingestion time.

## 2016-07-11 NOTE — [facility] Suicide Risk Assessment (Cosign Needed)
Suicide Risk Assessment  Discharge Assessment   [facility]   Principal Problem: Major depression Discharge Diagnoses:  Patient Active Problem List   Diagnosis Date Noted  . Major depression [F32.9] 04/26/2015    Priority: High  . Mild recurrent major depression ([facility]) [F33.0] 05/18/2015  . Alcohol abuse [F10.10] 04/26/2015  . Substance induced mood disorder ([facility]) [F19.94] 04/26/2015  . Alcohol withdrawal ([facility]) [F10.239] 04/26/2015  . Self-inflicted injury [Z72.89] 04/26/2015  . Seizure ([facility]) [R56.9] 04/25/2015    Total Time spent with patient: 30 minutes  Musculoskeletal: Strength & Muscle Tone: within normal limits Gait & Station: normal Patient leans: N/A  Psychiatric Specialty Exam:   Blood pressure 122/83, pulse 91, temperature 98.2 F (36.8 C), temperature source Oral, resp. rate 18, last menstrual period 07/06/2016, SpO2 99 %.There is no height or weight on file to calculate BMI.   General Appearance: Neat  Eye Contact:  Fair  Speech:  Clear and Coherent  Volume:  Normal  Mood:  Euthymic  Affect:  Congruent  Thought Process:  Coherent  Orientation:  Full (Time, Place, and Person)  Thought Content:  Logical  Suicidal Thoughts:  No Denies  Homicidal Thoughts:  No denies  Memory:  Immediate;   Fair Recent;   Fair Remote;   Fair  Judgement:  Fair  Insight:  Fair  Psychomotor Activity:  Normal  Concentration:  Concentration: Fair and Attention Span: Fair  Recall:  Good  Fund of Knowledge:  Good  Language:  Good  Akathisia:  No  Handed:  Right  AIMS (if indicated):     Assets:  Resilience  ADL's:  Intact  Cognition:  WNL  Sleep:  6 hrs    Mental Status Per Nursing Assessment::   On Admission:  Alert and oriented   Demographic Factors:  NA  Loss Factors: Loss of significant relationship  Historical Factors: Impulsivity and drug abuse history family  Risk Reduction Factors:   Employed  Continued Clinical Symptoms:   Severe Anxiety and/or Agitation Depression:   Impulsivity Alcohol/Substance Abuse/Dependencies Previous Psychiatric Diagnoses and Treatments Medical Diagnoses and Treatments/Surgeries  Cognitive Features That Contribute To Risk:  Closed-mindedness    Suicide Risk:  Minimal: No identifiable suicidal ideation.  Patients presenting with no risk factors but with morbid ruminations; may be classified as minimal risk based on the severity of the depressive symptoms   Plan Of Care/Follow-up recommendations:  Activity:  as tol Diet:  as tol Other:  [facility] [facility]  Lantz Hermann May Bethene Hankinson, NP  [facility] 07/11/2016, 12:07 PM

## 2016-07-11 NOTE — ED Notes (Signed)
Pt aware we need a urine sample, will call when ready

## 2016-07-11 NOTE — BH Assessment (Signed)
Tele Assessment Note   Tara Hensley is an 31 y.o. female, Caucasian, Married who presents to [facility] ED per ED report: PMHx of anxiety, depression, bipolar disorder, seizures and substance abuse who presents to the Emergency Department after taking 70 mg of Xanax and 700 mg of Lamictal about four hours ago. She states she also drank "a lot" of rum. She states she went to sleep and woke up with a severe HA and vomiting. She states she then started having a seizure. Pt reports CP and palpitations due to anxiety. She describes it as someone sitting on her chest. She reports her last seizure was about nine months ago due to alcohol withdrawal. She reports alcohol abuse began about two years ago and her last ETOH use was yesterday prior to this incident. She states her mother recently passed away and she is under a lot of stress lately.   Patient states primary concern is depression and increasing alcohol consumption. Patient states that her mother passed away x 1 year ago in 2022/09/09 and still has not gotten through that. Patient states she recently started seeing psychiatrist at [facility]. Patient states she has lost some weight and no real appetite followed by states has not been sleeping lately. Patient resides with husband.  Patient denies current SI/HI and AVH. Patient acknowledges S.A. With alcohol last use on 07/11/16 for unknown amounts. Patient states she has been seen inpatient for psych care last in 2016 at [facility] for depression. Patient states she is currently seen outpatient via the [facility].  Patient is dressed in scrubs and is alert and oriented x4. Patient speech was within normal limits and motor behavior appeared normal. Patient thought process is coherent. Patient  does not appear to be responding to internal stimuli. Patient was cooperative throughout the assessment and states that  she not is agreeable to inpatient psychiatric treatment.   Diagnosis:  Major Depressive Disorder; Alcohol Use Disorder, Severe  Past Medical History:  Past Medical History:  Diagnosis Date  . Anxiety   . Depression   . Seizures (HCC) 05/2013 and 09/2013   2 episodes in lifetime, not evaluated by neurologist due to lack of insurance.  . Substance abuse     History reviewed. No pertinent surgical history.  Family History:  Family History  Problem Relation Age of Onset  . Drug abuse Mother   . Hepatitis C Mother   . Mental illness Brother   . Diabetes Paternal Uncle   . Diabetes Paternal Grandmother   . Diabetes Paternal Grandfather   . Cancer Paternal Grandfather     Social History:  reports that she has been smoking Cigarettes.  She has been smoking about 0.50 packs per day. She uses smokeless tobacco. She reports that she drinks alcohol. She reports that she does not use drugs.  Additional Social History:  Alcohol / Drug Use Pain Medications: SEE MAR Prescriptions: SEE MAR Over the Counter: SEE MAR History of alcohol / drug use?: Yes Longest period of sobriety (when/how long): unspecified Negative Consequences of Use: Financial, Legal, Personal relationships Withdrawal Symptoms: Patient aware of relationship between substance abuse and physical/medical complications Substance #1 Name of Substance 1: alcohol 1 - Age of First Use: unspecified 1 - Amount (size/oz): unspecified amount 1 - Frequency: daily 1 - Duration: years 1 - Last Use / Amount: 07/11/16 unknown amount  CIWA: CIWA-Ar BP: 110/69 Pulse Rate: 97 COWS:    PATIENT STRENGTHS: (choose at least two) Ability for insight  Average or above average intelligence Capable of independent living Communication skills  Allergies:  Allergies  Allergen Reactions  . Claritin [Loratadine] Hives    Home Medications:  (Not in a hospital admission)  OB/GYN Status:  Patient's last menstrual period was 07/06/2016.  General Assessment Data Location of Assessment: [facility] ED TTS Assessment:  In system Is this a Tele or Face-to-Face Assessment?: Tele Assessment Is this an Initial Assessment or a Re-assessment for this encounter?: Initial Assessment Marital status: Married Is patient pregnant?: No Pregnancy Status: No Living Arrangements: Spouse/significant other Can pt return to current living arrangement?: Yes Admission Status: Voluntary Is patient capable of signing voluntary admission?: Yes Referral Source: Other Insurance type: [facility]     Crisis Care Plan Living Arrangements: Spouse/significant other Name of Psychiatrist: [facility] Name of Therapist: [facility]  Education Status Is patient currently in school?: No Current Grade: n/a Highest grade of school patient has completed: 12th Name of school: n/a Contact person: husband  Risk to self with the past 6 months Suicidal Ideation: No Has patient been a risk to self within the past 6 months prior to admission? : Yes Suicidal Intent: No Has patient had any suicidal intent within the past 6 months prior to admission? : Yes Is patient at risk for suicide?: Yes Suicidal Plan?: No Has patient had any suicidal plan within the past 6 months prior to admission? : No Access to Means: Yes Specify Access to Suicidal Means: access to pills What has been your use of drugs/alcohol within the last 12 months?: alcohol, severe Previous Attempts/Gestures: No How many times?: 0 Other Self Harm Risks: none Triggers for Past Attempts: Unknown Intentional Self Injurious Behavior: None Family Suicide History: No Recent stressful life event(s): Turmoil (Comment) Persecutory voices/beliefs?: No Depression: Yes Depression Symptoms: Insomnia, Tearfulness, Isolating, Fatigue, Guilt, Loss of interest in usual pleasures, Feeling worthless/self pity Substance abuse history and/or treatment for substance abuse?: Yes Suicide prevention information given to non-admitted patients: Yes  Risk to Others within the  past 6 months Homicidal Ideation: No Does patient have any lifetime risk of violence toward others beyond the six months prior to admission? : No Thoughts of Harm to Others: No Current Homicidal Intent: No Current Homicidal Plan: No Access to Homicidal Means: No Identified Victim: none History of harm to others?: No Assessment of Violence: None Noted Violent Behavior Description: n/a Does patient have access to weapons?: No Criminal Charges Pending?: No Does patient have a court date: No Is patient on probation?: No  Psychosis Hallucinations: None noted Delusions: None noted  Mental Status Report Appearance/Hygiene: In scrubs Eye Contact: Fair Motor Activity: Freedom of movement Speech: Logical/coherent Level of Consciousness: Alert Mood: Depressed Affect: Depressed Anxiety Level: Moderate Thought Processes: Relevant Judgement: Impaired Orientation: Person, Place, Time, Situation, Appropriate for developmental age Obsessive Compulsive Thoughts/Behaviors: Moderate  Cognitive Functioning Concentration: Decreased Memory: Recent Intact, Remote Intact IQ: Average Insight: Fair Impulse Control: Poor Appetite: Poor Weight Loss: 7 Weight Gain: 0 Sleep: Decreased Total Hours of Sleep: 0 Vegetative Symptoms: None  ADLScreening [facility] Assessment Services) Patient's cognitive ability adequate to safely complete daily activities?: Yes Patient able to express need for assistance with ADLs?: Yes Independently performs ADLs?: Yes (appropriate for developmental age)  Prior Inpatient Therapy Prior Inpatient Therapy: Yes Prior Therapy Dates: 2016 Prior Therapy Facilty/Provider(s): [facility] Reason for Treatment: depression  Prior Outpatient Therapy Prior Outpatient Therapy: Yes Prior Therapy Dates: current Prior Therapy Facilty/Provider(s): [facility] Reason for Treatment: Depression, SA Does patient have  an ACCT team?: No Does patient have Intensive In-House  Services?  : No Does patient have [facility] services? : No Does patient have P4CC services?: No  ADL Screening (condition at time of admission) Patient's cognitive ability adequate to safely complete daily activities?: Yes Is the patient deaf or have difficulty hearing?: No Does the patient have difficulty seeing, even when wearing glasses/contacts?: No Does the patient have difficulty concentrating, remembering, or making decisions?: No Patient able to express need for assistance with ADLs?: Yes Does the patient have difficulty dressing or bathing?: No Independently performs ADLs?: Yes (appropriate for developmental age) Does the patient have difficulty walking or climbing stairs?: No Weakness of Legs: None Weakness of Arms/Hands: None       Abuse/Neglect Assessment (Assessment to be complete while patient is alone) Physical Abuse: Denies Verbal Abuse: Denies Sexual Abuse: Denies Exploitation of patient/patient's resources: Denies Self-Neglect: Denies Values / Beliefs Spiritual Requests During Hospitalization: Other (comment) (request chaplain visit) Consults Spiritual Care Consult Needed: Yes (Comment) (pt request chaplain support) Advance Directives (For Healthcare) Does Patient Have a Medical Advance Directive?: No    Additional Information 1:1 In Past 12 Months?: No CIRT Risk: No Elopement Risk: No Does patient have medical clearance?: Yes     Disposition: Per Donell SievertSpencer, Simon  NP recommend a.m. Psych evaluation Disposition Initial Assessment Completed for this Encounter: Yes Disposition of Patient: Other dispositions (TBD)  Elbie Statzer K Kenith Trickel 07/11/2016 4:12 AM

## 2016-07-11 NOTE — ED Triage Notes (Addendum)
Per EMS pt coming in with alcohol intoxication. Pt admits to taking 700mg  Lamictal, 7mg  Xanax, and drinking an unspecified amount of rum at 9:30pm. Pt also admits to taking the medication/alcohol with intent to harm herself. Pt had vomiting episode prior to arrival. Vomit reported as being "neon yellow'". Pt has hx of alcohol induced seizures. Pt recently diagnosed with pneumonia. Bruise noted over pts left eye, pt reports the bruise came from falling in the shower.  CBG 93  124/89 p 100 resp 20

## 2016-07-11 NOTE — ED Provider Notes (Signed)
[facility]-EMERGENCY DEPT Provider Note    By signing my name below, I, Earmon Phoenix, attest that this documentation has been prepared under the direction and in the presence of Pricilla Loveless, MD. Electronically Signed: Earmon Phoenix, ED Scribe. 07/11/16. 3:13 AM.    History   Chief Complaint Chief Complaint  Patient presents with  . Alcohol Intoxication   The history is provided by the patient and medical records. No language interpreter was used.    Tara Hensley is a 31 y.o. female with PMHx of anxiety, depression, bipolar disorder, seizures and substance abuse who presents to the Emergency Department after taking 70 mg of Xanax and 700 mg of Lamictal about four hours ago. She states she also drank "a lot" of rum. She states she went to sleep and woke up with a severe HA and vomiting. She states she then started convulsing and may have had a seizure. She is not sure if she was awake during the shaking. Pt reports CP and palpitations due to anxiety. She describes it as someone sitting on her chest. She reports her last seizure was about nine months ago due to alcohol withdrawal. She reports alcohol abuse began about two years ago and her last ETOH use was yesterday prior to this incident. She states her mother recently passed away and she is under a lot of stress lately. There are no other modifying factors noted. She denies abdominal pain. She has bruising over the left eye which she reports getting after falling and hitting the area on the toilet. She was diagnosed with pneumonia at [facility] and was prescribed Augmentin, an MDI and nebulizer. She began those treatments yesterday. She is currently a smoker.   Past Medical History:  Diagnosis Date  . Anxiety   . Depression   . Seizures (HCC) 05/2013 and 09/2013   2 episodes in lifetime, not evaluated by neurologist due to lack of insurance.  . Substance abuse     Patient Active Problem List   Diagnosis Date Noted  . Mild  recurrent major depression (HCC) 05/18/2015  . Alcohol abuse 04/26/2015  . Substance induced mood disorder (HCC) 04/26/2015  . Major depression 04/26/2015  . Alcohol withdrawal (HCC) 04/26/2015  . Self-inflicted injury 04/26/2015  . Seizure (HCC) 04/25/2015    History reviewed. No pertinent surgical history.  OB History    No data available       Home Medications    Prior to Admission medications   Medication Sig Start Date End Date Taking? Authorizing Provider  albuterol (PROVENTIL HFA;VENTOLIN HFA) 108 (90 Base) MCG/ACT inhaler Inhale 2 puffs into the lungs every 6 (six) hours as needed for wheezing or shortness of breath. 07/09/16  Yes Nche, Bonna Gains, NP  ALPRAZolam Prudy Feeler) 1 MG tablet Take 1 tablet by mouth daily as needed for anxiety.  07/02/16  Yes [provider]  amoxicillin-clavulanate (AUGMENTIN) 875-125 MG tablet Take 1 tablet by mouth 2 (two) times daily. 07/09/16   Nche, Bonna Gains, NP  benzonatate (TESSALON) 100 MG capsule Take 1 capsule (100 mg total) by mouth 3 (three) times daily as needed for cough. 07/09/16   Nche, Bonna Gains, NP  dextromethorphan-guaiFENesin (MUCINEX DM) 30-600 MG 12hr tablet Take 1 tablet by mouth 2 (two) times daily as needed for cough. Patient not taking: Reported on 07/11/2016 07/09/16   Nche, Bonna Gains, NP  fluticasone (FLONASE) 50 MCG/ACT nasal spray Place 2 sprays into both nostrils daily. 07/09/16   Nche, Bonna Gains, NP  Norgestimate-Ethinyl Estradiol Triphasic 0.18/0.215/0.25  MG-25 MCG tab Take 1 tablet by mouth daily. Patient not taking: Reported on 07/11/2016 11/18/15   Nche, Bonna Gains, NP  oxymetazoline (AFRIN NASAL SPRAY) 0.05 % nasal spray Place 1 spray into both nostrils 2 (two) times daily. Use only for 3days, then stop 07/09/16   Nche, Bonna Gains, NP    Family History Family History  Problem Relation Age of Onset  . Drug abuse Mother   . Hepatitis C Mother   . Mental illness Brother   . Diabetes Paternal  Uncle   . Diabetes Paternal Grandmother   . Diabetes Paternal Grandfather   . Cancer Paternal Grandfather     Social History Social History  Substance Use Topics  . Smoking status: Current Every Day Smoker    Packs/day: 0.50    Types: Cigarettes  . Smokeless tobacco: Current User  . Alcohol use Yes     Comment: occasionally     Allergies   Claritin [loratadine]   Review of Systems Review of Systems  Constitutional:       Alcohol intoxication  Cardiovascular: Positive for chest pain and palpitations.  Gastrointestinal: Positive for vomiting. Negative for abdominal pain.  Skin: Positive for color change.  Neurological: Positive for headaches.  All other systems reviewed and are negative.    Physical Exam Updated Vital Signs BP 110/69   Pulse 97   Resp 18   LMP 07/06/2016   SpO2 97%   Physical Exam  Constitutional: She is oriented to person, place, and time. She appears well-developed and well-nourished.  HENT:  Head: Normocephalic.  Right Ear: External ear normal.  Left Ear: External ear normal.  Nose: Nose normal.  Periorbital ecchymosis to left eye.  Eyes: Right eye exhibits no discharge. Left eye exhibits no discharge.  Cardiovascular: Normal rate, regular rhythm and normal heart sounds.   Pulmonary/Chest: Effort normal and breath sounds normal.  Abdominal: Soft. There is no tenderness.  Neurological: She is alert and oriented to person, place, and time. She displays tremor (mild, intention tremor with BUE).  CN 3-12 grossly intact. 5/5 strength in all 4 extremities. Grossly normal sensation. Normal finger to nose.   Skin: Skin is warm and dry.  Psychiatric:  Depressed and anxious. Denies current SI.  Nursing note and vitals reviewed.    ED Treatments / Results  DIAGNOSTIC STUDIES: Oxygen Saturation is 97% on RA, normal by my interpretation.   COORDINATION OF CARE: 1:08 AM- Will order labs, EKG, Zofran and fluids. Will order CT of head and CXR. Pt  verbalizes understanding and agrees to plan.  Medications  sodium chloride 0.9 % bolus 1,000 mL (1,000 mLs Intravenous New Bag/Given 07/11/16 0215)    Labs (all labs ordered are listed, but only abnormal results are displayed) Labs Reviewed  CBC WITH DIFFERENTIAL/PLATELET - Abnormal; Notable for the following:       Result Value   MCV 103.0 (*)    MCH 34.9 (*)    All other components within normal limits  BASIC METABOLIC PANEL - Abnormal; Notable for the following:    Calcium 8.6 (*)    All other components within normal limits  ETHANOL - Abnormal; Notable for the following:    Alcohol, Ethyl (B) 127 (*)    All other components within normal limits  ACETAMINOPHEN LEVEL - Abnormal; Notable for the following:    Acetaminophen (Tylenol), Serum <10 (*)    All other components within normal limits  SALICYLATE LEVEL  RAPID URINE DRUG SCREEN, HOSP PERFORMED  I-STAT BETA  HCG BLOOD, ED ([facility], [facility], [facility] ONLY)    EKG  EKG Interpretation None       Radiology Dg Chest 2 View  Result Date: 07/11/2016 CLINICAL DATA:  Cough and fever.  Question pneumonia. EXAM: CHEST  2 VIEW COMPARISON:  Radiographs 09/06/2015 FINDINGS: The cardiomediastinal contours are normal. The lungs are clear. Pulmonary vasculature is normal. No consolidation, pleural effusion, or pneumothorax. No acute osseous abnormalities are seen. Multiple overlying monitoring devices project over the lower thorax. IMPRESSION: No acute pulmonary process. Electronically Signed   By: Rubye OaksMelanie  Ehinger M.D.   On: 07/11/2016 01:50   Ct Head Wo Contrast  Result Date: 07/11/2016 CLINICAL DATA:  Headache, vomiting, and seizure. History of bipolar disorders, seizure, alcohol and substance abuse. EXAM: CT HEAD WITHOUT CONTRAST TECHNIQUE: Contiguous axial images were obtained from the base of the skull through the vertex without intravenous contrast. COMPARISON:  05/17/2015 FINDINGS: Brain: No evidence of acute infarction, hemorrhage, hydrocephalus,  extra-axial collection or mass lesion/mass effect. Vascular: No hyperdense vessel or unexpected calcification. Skull: Normal. Negative for fracture or focal lesion. Sinuses/Orbits: Mucosal thickening in the paranasal sinuses. No acute air-fluid levels. Mastoid air cells are not opacified. Other: No significant changes since previous study. IMPRESSION: No acute intracranial abnormalities. Electronically Signed   By: Burman NievesWilliam  Stevens M.D.   On: 07/11/2016 02:21    Procedures Procedures (including critical care time)  Medications Ordered in ED Medications  sodium chloride 0.9 % bolus 1,000 mL (1,000 mLs Intravenous New Bag/Given 07/11/16 0215)     Initial Impression / Assessment and Plan / ED Course  I have reviewed the triage vital signs and the nursing notes.  Pertinent labs & imaging results that were available during my care of the patient were reviewed by me and considered in my medical decision making (see chart for details).     Given ecchymosis and possible seizure, head CT obtained, no acute findings. No obvious pneumonia either. Possible seizure tonight, but does not appear to have alcohol withdrawal, and based on the amount of xanax taken she appears to be covered. She currently denies SI but needs psych consult based on intentional overdose. At this time she is medically stable for psychiatric disposition.  Final Clinical Impressions(s) / ED Diagnoses   Final diagnoses:  Alcoholic intoxication with complication (HCC)  Intentional drug overdose, initial encounter [facility](HCC)    New Prescriptions New Prescriptions   No medications on file    I personally performed the services described in this documentation, which was scribed in my presence. The recorded information has been reviewed and is accurate.     Pricilla LovelessGoldston, Kesha Hurrell, MD 07/11/16 1009

## 2016-07-12 ENCOUNTER — Ambulatory Visit (INDEPENDENT_AMBULATORY_CARE_PROVIDER_SITE_OTHER): Payer: 59 | Admitting: Nurse Practitioner

## 2016-07-12 ENCOUNTER — Encounter: Payer: Self-pay | Admitting: Nurse Practitioner

## 2016-07-12 VITALS — BP 132/76 | HR 114 | Temp 98.4°F | Ht 63.0 in | Wt 129.0 lb

## 2016-07-12 DIAGNOSIS — J069 Acute upper respiratory infection, unspecified: Secondary | ICD-10-CM

## 2016-07-12 DIAGNOSIS — J209 Acute bronchitis, unspecified: Secondary | ICD-10-CM | POA: Diagnosis not present

## 2016-07-12 MED ORDER — ALBUTEROL SULFATE HFA 108 (90 BASE) MCG/ACT IN AERS: 1.0000 | INHALATION_SPRAY | Freq: Four times a day (QID) | RESPIRATORY_TRACT | 1 refills | Status: AC | PRN

## 2016-07-12 MED ORDER — METHYLPREDNISOLONE 4 MG PO TBPK: ORAL_TABLET | ORAL | 0 refills | Status: AC

## 2016-07-12 MED ORDER — IPRATROPIUM-ALBUTEROL 0.5-2.5 (3) MG/3ML IN SOLN
3.0000 mL | Freq: Once | RESPIRATORY_TRACT | Status: AC
  Administered 2016-07-12: 3 mL via RESPIRATORY_TRACT

## 2016-07-12 MED ORDER — AZITHROMYCIN 250 MG PO TABS
250.0000 mg | ORAL_TABLET | Freq: Every day | ORAL | 0 refills | Status: AC
Start: 2016-07-12 — End: ?

## 2016-07-12 NOTE — Patient Instructions (Signed)
Since you are having nausea with augmentin, stop medication. Start azithromycin/  Start oral medrol today.  Use robitussin DM or delsym or mucinex DM for cough.  Encourage adequate oral hydration.

## 2016-07-12 NOTE — Progress Notes (Signed)
Subjective:  Patient ID: Tara Hensley, female    DOB: 1985/03/10  Age: 31 y.o. MRN: [id number]  CC: Cough (bad coughing,see blood at time,weak,haed time breathing, not better at all, fever at time)   Cough  This is a new problem. The current episode started 1 to 4 weeks ago. The problem has been unchanged. The problem occurs constantly. The cough is non-productive. Associated symptoms include nasal congestion, postnasal drip, rhinorrhea, shortness of breath and wheezing. Pertinent negatives include no chest pain, chills or fever. The symptoms are aggravated by lying down and cold air. She has tried a beta-agonist inhaler, OTC cough suppressant and prescription cough suppressant for the symptoms. Her past medical history is significant for bronchitis and environmental allergies. There is no history of pneumonia.  CXR 07/11/16 (normal). augmentin caused nausea.  Outpatient Medications Prior to Visit  Medication Sig Dispense Refill  . ALPRAZolam (XANAX) 1 MG tablet Take 1 tablet by mouth daily as needed for anxiety.     . benzonatate (TESSALON) 100 MG capsule Take 1 capsule (100 mg total) by mouth 3 (three) times daily as needed for cough. 20 capsule 0  . dextromethorphan-guaiFENesin (MUCINEX DM) 30-600 MG 12hr tablet Take 1 tablet by mouth 2 (two) times daily as needed for cough. 14 tablet 0  . fluticasone (FLONASE) 50 MCG/ACT nasal spray Place 2 sprays into both nostrils daily. 16 g 0  . Norgestimate-Ethinyl Estradiol Triphasic 0.18/0.215/0.25 MG-25 MCG tab Take 1 tablet by mouth daily. 1 Package 11  . oxymetazoline (AFRIN NASAL SPRAY) 0.05 % nasal spray Place 1 spray into both nostrils 2 (two) times daily. Use only for 3days, then stop 30 mL 0  . albuterol (PROVENTIL HFA;VENTOLIN HFA) 108 (90 Base) MCG/ACT inhaler Inhale 2 puffs into the lungs every 6 (six) hours as needed for wheezing or shortness of breath. 1 Inhaler 2  . amoxicillin-clavulanate (AUGMENTIN) 875-125 MG tablet Take 1 tablet  by mouth 2 (two) times daily. 14 tablet 0   No facility-administered medications prior to visit.     ROS See HPI  Objective:  BP 132/76   Pulse (!) 114   Temp 98.4 F (36.9 C)   Ht 5\' 3"  (1.6 m)   Wt 129 lb (58.5 kg)   LMP 07/06/2016   SpO2 98%   BMI 22.85 kg/m   BP Readings from Last 3 Encounters:  07/12/16 132/76  07/11/16 122/83  07/09/16 114/78    Wt Readings from Last 3 Encounters:  07/12/16 129 lb (58.5 kg)  07/09/16 126 lb (57.2 kg)  11/18/15 124 lb (56.2 kg)    Physical Exam  Constitutional: She is oriented to person, place, and time.  HENT:  Right Ear: Tympanic membrane, external ear and ear canal normal.  Left Ear: Tympanic membrane, external ear and ear canal normal.  Nose: Mucosal edema and rhinorrhea present. Right sinus exhibits maxillary sinus tenderness. Right sinus exhibits no frontal sinus tenderness. Left sinus exhibits maxillary sinus tenderness. Left sinus exhibits no frontal sinus tenderness.  Mouth/Throat: Uvula is midline. No trismus in the jaw. Posterior oropharyngeal erythema present. No oropharyngeal exudate.  Eyes: No scleral icterus.  Neck: Normal range of motion. Neck supple.  Cardiovascular: Normal rate and normal heart sounds.   Pulmonary/Chest: Effort normal. She has wheezes. She has no rales.  Musculoskeletal: She exhibits no edema.  Lymphadenopathy:    She has no cervical adenopathy.  Neurological: She is alert and oriented to person, place, and time.  Vitals reviewed.   Lab Results  Component  Value Date   WBC 7.0 07/11/2016   HGB 13.9 07/11/2016   HCT 41.0 07/11/2016   PLT 241 07/11/2016   GLUCOSE 88 07/11/2016   ALT 13 (L) 08/08/2015   AST 24 08/08/2015   NA 140 07/11/2016   K 3.9 07/11/2016   CL 106 07/11/2016   CREATININE 0.75 07/11/2016   BUN 13 07/11/2016   CO2 25 07/11/2016    Dg Chest 2 View  Result Date: 07/11/2016 CLINICAL DATA:  Cough and fever.  Question pneumonia. EXAM: CHEST  2 VIEW COMPARISON:   Radiographs 09/06/2015 FINDINGS: The cardiomediastinal contours are normal. The lungs are clear. Pulmonary vasculature is normal. No consolidation, pleural effusion, or pneumothorax. No acute osseous abnormalities are seen. Multiple overlying monitoring devices project over the lower thorax. IMPRESSION: No acute pulmonary process. Electronically Signed   By: Rubye Oaks M.D.   On: 07/11/2016 01:50   Ct Head Wo Contrast  Result Date: 07/11/2016 CLINICAL DATA:  Headache, vomiting, and seizure. History of bipolar disorders, seizure, alcohol and substance abuse. EXAM: CT HEAD WITHOUT CONTRAST TECHNIQUE: Contiguous axial images were obtained from the base of the skull through the vertex without intravenous contrast. COMPARISON:  05/17/2015 FINDINGS: Brain: No evidence of acute infarction, hemorrhage, hydrocephalus, extra-axial collection or mass lesion/mass effect. Vascular: No hyperdense vessel or unexpected calcification. Skull: Normal. Negative for fracture or focal lesion. Sinuses/Orbits: Mucosal thickening in the paranasal sinuses. No acute air-fluid levels. Mastoid air cells are not opacified. Other: No significant changes since previous study. IMPRESSION: No acute intracranial abnormalities. Electronically Signed   By: Burman Nieves M.D.   On: 07/11/2016 02:21    Assessment & Plan:   Tara Hensley was seen today for cough.  Diagnoses and all orders for this visit:  Acute bronchitis, unspecified organism -     ipratropium-albuterol (DUONEB) 0.5-2.5 (3) MG/3ML nebulizer solution 3 mL; Take 3 mLs by nebulization once. -     azithromycin (ZITHROMAX Z-PAK) 250 MG tablet; Take 1 tablet (250 mg total) by mouth daily. Take 2tabs on first day, then 1tab once a day till complete -     methylPREDNISolone (MEDROL DOSEPAK) 4 MG TBPK tablet; Take as directed on package -     albuterol (PROVENTIL HFA;VENTOLIN HFA) 108 (90 Base) MCG/ACT inhaler; Inhale 1-2 puffs into the lungs every 6 (six) hours as needed for  wheezing or shortness of breath.  Acute URI -     ipratropium-albuterol (DUONEB) 0.5-2.5 (3) MG/3ML nebulizer solution 3 mL; Take 3 mLs by nebulization once. -     azithromycin (ZITHROMAX Z-PAK) 250 MG tablet; Take 1 tablet (250 mg total) by mouth daily. Take 2tabs on first day, then 1tab once a day till complete -     methylPREDNISolone (MEDROL DOSEPAK) 4 MG TBPK tablet; Take as directed on package   I have discontinued Ms. Pokorny's amoxicillin-clavulanate, albuterol, and PROAIR HFA. I am also having her start on azithromycin, methylPREDNISolone, and albuterol. Additionally, I am having her maintain her Norgestimate-Ethinyl Estradiol Triphasic, dextromethorphan-guaiFENesin, oxymetazoline, fluticasone, benzonatate, and ALPRAZolam. We administered ipratropium-albuterol.  Meds ordered this encounter  Medications  . DISCONTD: PROAIR HFA 108 (90 Base) MCG/ACT inhaler  . ipratropium-albuterol (DUONEB) 0.5-2.5 (3) MG/3ML nebulizer solution 3 mL  . azithromycin (ZITHROMAX Z-PAK) 250 MG tablet    Sig: Take 1 tablet (250 mg total) by mouth daily. Take 2tabs on first day, then 1tab once a day till complete    Dispense:  6 tablet    Refill:  0    Order Specific Question:  Supervising Provider    Answer:   Tresa Garter [[id number]]  . methylPREDNISolone (MEDROL DOSEPAK) 4 MG TBPK tablet    Sig: Take as directed on package    Dispense:  21 tablet    Refill:  0    Order Specific Question:   Supervising Provider    Answer:   Tresa Garter [[id number]]  . albuterol (PROVENTIL HFA;VENTOLIN HFA) 108 (90 Base) MCG/ACT inhaler    Sig: Inhale 1-2 puffs into the lungs every 6 (six) hours as needed for wheezing or shortness of breath.    Dispense:  1 Inhaler    Refill:  1    Order Specific Question:   Supervising Provider    Answer:   Tresa Garter [[id number]]    Follow-up: Return if symptoms worsen or fail to improve.  Alysia Penna, NP

## 2016-07-13 ENCOUNTER — Emergency Department (HOSPITAL_COMMUNITY): Payer: 59

## 2016-07-13 ENCOUNTER — Encounter (HOSPITAL_COMMUNITY): Payer: Self-pay | Admitting: Emergency Medicine

## 2016-07-13 ENCOUNTER — Observation Stay (HOSPITAL_COMMUNITY)
Admission: EM | Admit: 2016-07-13 | Discharge: 2016-07-13 | DRG: 072 | Discharge status: Against Medical Advice | Payer: 59 | Attending: Internal Medicine | Admitting: Internal Medicine

## 2016-07-13 DIAGNOSIS — G9340 Encephalopathy, unspecified: Secondary | ICD-10-CM

## 2016-07-13 DIAGNOSIS — R4182 Altered mental status, unspecified: Secondary | ICD-10-CM | POA: Diagnosis present

## 2016-07-13 LAB — CBC WITH DIFFERENTIAL/PLATELET
BASOS ABS: 0 10*3/uL (ref 0.0–0.1)
Basophils Relative: 0 %
EOS PCT: 0 %
Eosinophils Absolute: 0 10*3/uL (ref 0.0–0.7)
HCT: 43 % (ref 36.0–46.0)
HEMOGLOBIN: 14.5 g/dL (ref 12.0–15.0)
LYMPHS PCT: 7 %
Lymphs Abs: 0.7 10*3/uL (ref 0.7–4.0)
MCH: 34.8 pg — ABNORMAL HIGH (ref 26.0–34.0)
MCHC: 33.7 g/dL (ref 30.0–36.0)
MCV: 103.1 fL — AB (ref 78.0–100.0)
Monocytes Absolute: 0.5 10*3/uL (ref 0.1–1.0)
Monocytes Relative: 6 %
NEUTROS ABS: 8.2 10*3/uL — AB (ref 1.7–7.7)
NEUTROS PCT: 87 %
PLATELETS: 253 10*3/uL (ref 150–400)
RBC: 4.17 MIL/uL (ref 3.87–5.11)
RDW: 14.1 % (ref 11.5–15.5)
WBC: 9.4 10*3/uL (ref 4.0–10.5)

## 2016-07-13 LAB — I-STAT CHEM 8, ED
BUN: 12 mg/dL (ref 6–20)
CHLORIDE: 101 mmol/L (ref 101–111)
Calcium, Ion: 1.09 mmol/L — ABNORMAL LOW (ref 1.15–1.40)
Creatinine, Ser: 0.7 mg/dL (ref 0.44–1.00)
GLUCOSE: 73 mg/dL (ref 65–99)
HEMATOCRIT: 45 % (ref 36.0–46.0)
HEMOGLOBIN: 15.3 g/dL — AB (ref 12.0–15.0)
POTASSIUM: 4.2 mmol/L (ref 3.5–5.1)
SODIUM: 140 mmol/L (ref 135–145)
TCO2: 27 mmol/L (ref 0–100)

## 2016-07-13 LAB — URINALYSIS, ROUTINE W REFLEX MICROSCOPIC
Bacteria, UA: NONE SEEN
Bilirubin Urine: NEGATIVE
Glucose, UA: NEGATIVE mg/dL
HGB URINE DIPSTICK: NEGATIVE
Ketones, ur: NEGATIVE mg/dL
Leukocytes, UA: NEGATIVE
Nitrite: NEGATIVE
PROTEIN: NEGATIVE mg/dL
SPECIFIC GRAVITY, URINE: 1.013 (ref 1.005–1.030)
pH: 7 (ref 5.0–8.0)

## 2016-07-13 LAB — COMPREHENSIVE METABOLIC PANEL
ALBUMIN: 4.5 g/dL (ref 3.5–5.0)
ALK PHOS: 61 U/L (ref 38–126)
ALT: 23 U/L (ref 14–54)
AST: 34 U/L (ref 15–41)
Anion gap: 10 (ref 5–15)
BUN: 12 mg/dL (ref 6–20)
CALCIUM: 8.9 mg/dL (ref 8.9–10.3)
CO2: 26 mmol/L (ref 22–32)
Chloride: 103 mmol/L (ref 101–111)
Creatinine, Ser: 0.73 mg/dL (ref 0.44–1.00)
GFR calc Af Amer: >60 mL/min (ref >60)
GFR calc non Af Amer: >60 mL/min (ref >60)
Glucose, Bld: 77 mg/dL (ref 65–99)
POTASSIUM: 4.2 mmol/L (ref 3.5–5.1)
SODIUM: 139 mmol/L (ref 135–145)
TOTAL PROTEIN: 8.1 g/dL (ref 6.5–8.1)
Total Bilirubin: 0.7 mg/dL (ref 0.3–1.2)

## 2016-07-13 LAB — RAPID URINE DRUG SCREEN, HOSP PERFORMED
AMPHETAMINES: NOT DETECTED
Barbiturates: NOT DETECTED
Benzodiazepines: POSITIVE — AB
Cocaine: NOT DETECTED
Opiates: NOT DETECTED
TETRAHYDROCANNABINOL: POSITIVE — AB

## 2016-07-13 LAB — PREGNANCY, URINE: PREG TEST UR: NEGATIVE

## 2016-07-13 LAB — ACETAMINOPHEN LEVEL: Acetaminophen (Tylenol), Serum: <10 ug/mL — ABNORMAL LOW (ref 10–30)

## 2016-07-13 LAB — SALICYLATE LEVEL

## 2016-07-13 LAB — ETHANOL: Alcohol, Ethyl (B): 62 mg/dL — ABNORMAL HIGH (ref <5)

## 2016-07-13 LAB — CK: CK TOTAL: 359 U/L — AB (ref 38–234)

## 2016-07-13 LAB — MRSA PCR SCREENING: MRSA BY PCR: NEGATIVE

## 2016-07-13 MED ORDER — ONDANSETRON HCL 4 MG/2ML IJ SOLN: 4.0000 mg | Freq: Once | INTRAMUSCULAR | Status: DC | PRN

## 2016-07-13 NOTE — ED Notes (Signed)
Bed: WA10 Expected date: 07/13/16 Expected time: 9:02 AM Means of arrival: Ambulance Comments: S/P Pneumonia

## 2016-07-13 NOTE — ED Notes (Signed)
Per Hospitalist, patient agreeing to go upstairs and be evaluated again later this afternoon.

## 2016-07-13 NOTE — ED Triage Notes (Signed)
Per EMS, patient's boyfriend found her unresponsive in bedroom. Upon EMS arrival, patient semi-responsive with vomit beside her. Possible aspiration? EMS suctioned vomit out of air way. Patient was recently dx with pneumonia. Patient has been taking her antibiotics however has not finished them. Patient vomiting with EMS.

## 2016-07-13 NOTE — Progress Notes (Signed)
Made aware by bedside RN that patient wants to leave AMA. The patient is crying and stated she'll sign whatever needs to signed but she is leaving. Dr Izola PriceMyers called and informed of patient's wishes and that AMA papers would be signed and patient walked to front lobby.

## 2016-07-13 NOTE — Progress Notes (Signed)
Please note that pt refused to be admitted and left ama.   Debbora PrestoMAGICK-Claudia Greenley, MD  [facility] Pager [phone]  If 7PM-7AM, please contact night-coverage [email or web address] Password [email or web address]

## 2016-07-13 NOTE — ED Provider Notes (Signed)
[facility] Provider Note   CSN: [id number] Arrival date & time: 07/13/16  0909     History   Chief Complaint Chief Complaint  Patient presents with  . Possible Aspiration  . Emesis   Level V caveat due to unresponsiveness. HPI Tara Hensley is a 31 y.o. female.  HPI Patient brought in the ER after boyfriend found her unresponsive. Semiresponsive for EMS. Seen in the ER 2 days ago with an overdose of Xanax and Lamictal. Also history of alcohol abuse and alcohol withdrawal seizures. Seen by psychiatry and cleared yesterday. Has been on antibiotics for potential pneumonia. Yesterday started on azithromycin by her primary care doctor. Reportedly has been vomiting. Patient cannot really provide any history at this time. Past Medical History:  Diagnosis Date  . Anxiety   . Depression   . Seizures (HCC) 05/2013 and 09/2013   2 episodes in lifetime, not evaluated by neurologist due to lack of insurance.  . Substance abuse     Patient Active Problem List   Diagnosis Date Noted  . Altered mental state 07/13/2016  . 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 Alcohol abuse 04/26/2015  . Substance induced mood disorder (HCC) 04/26/2015  . Major depression 04/26/2015  . Alcohol withdrawal (HCC) 04/26/2015  . Self-inflicted injury 04/26/2015  . Seizure (HCC) 04/25/2015    No past surgical history on file.  OB History    No data available       Home Medications    Prior to Admission medications   Medication Sig Start Date End Date Taking? Authorizing Provider  albuterol (PROVENTIL HFA;VENTOLIN HFA) 108 (90 Base) MCG/ACT inhaler Inhale 1-2 puffs into the lungs every 6 (six) hours as needed for wheezing or shortness of breath. 07/12/16   Nche, Bonna Gains, NP  ALPRAZolam Prudy Feeler) 1 MG tablet Take 1 tablet by mouth daily as needed for anxiety.  07/02/16   [provider]  azithromycin (ZITHROMAX Z-PAK) 250 MG tablet Take 1 tablet (250 mg total) by  mouth daily. Take 2tabs on first day, then 1tab once a day till complete 07/12/16   Nche, Bonna Gains, NP  benzonatate (TESSALON) 100 MG capsule Take 1 capsule (100 mg total) by mouth 3 (three) times daily as needed for cough. 07/09/16   Nche, Bonna Gains, NP  dextromethorphan-guaiFENesin (MUCINEX DM) 30-600 MG 12hr tablet Take 1 tablet by mouth 2 (two) times daily as needed for cough. 07/09/16   Nche, Bonna Gains, NP  fluticasone (FLONASE) 50 MCG/ACT nasal spray Place 2 sprays into both nostrils daily. 07/09/16   Nche, Bonna Gains, NP  methylPREDNISolone (MEDROL DOSEPAK) 4 MG TBPK tablet Take as directed on package 07/12/16   Nche, Bonna Gains, NP  Norgestimate-Ethinyl Estradiol Triphasic 0.18/0.215/0.25 MG-25 MCG tab Take 1 tablet by mouth daily. 11/18/15   Nche, Bonna Gains, NP  oxymetazoline (AFRIN NASAL SPRAY) 0.05 % nasal spray Place 1 spray into both nostrils 2 (two) times daily. Use only for 3days, then stop 07/09/16   Nche, Bonna Gains, NP    Family History Family History  Problem Relation Age of Onset  . Drug abuse Mother   . Hepatitis C Mother   . Mental illness Brother   . Diabetes Paternal Uncle   . Diabetes Paternal Grandmother   . Diabetes Paternal Grandfather   . Cancer Paternal Grandfather     Social History Social History  Substance Use Topics  . Smoking status: Current Every Day Smoker    Packs/day: 0.50    Types:  Cigarettes  . Smokeless tobacco: Current User  . Alcohol use Yes     Comment: occasionally     Allergies   Claritin [loratadine]   Review of Systems Review of Systems  Unable to perform ROS: Mental status change     Physical Exam Updated Vital Signs BP 115/78   Pulse 99   Temp 98.2 F (36.8 C) (Oral)   Resp 17   Ht 5\' 3"  (1.6 m)   Wt 124 lb 12.5 oz (56.6 kg)   LMP 07/06/2016   SpO2 99%   BMI 22.10 kg/m   Physical Exam  Constitutional: She appears well-developed.  HENT:  Periorbital ecchymosis stable on left eye.  Eyes:  Pupils are equal, round, and reactive to light.  Pupils around 4 mm but reactive  Cardiovascular:  Mild tachycardia  Pulmonary/Chest: Effort normal. No respiratory distress.  Abdominal: Soft. There is no tenderness.  Musculoskeletal: She exhibits no edema.  Neurological:  Patient will follow some commands such as opening her hand to request. Nonverbal. Will look at me. It is spontaneously moving all extremities. Does have clonus in bilateral lower extremities.  Skin: Skin is warm. Capillary refill takes less than 2 seconds.     ED Treatments / Results  Labs (all labs ordered are listed, but only abnormal results are displayed) Labs Reviewed  CBC WITH DIFFERENTIAL/PLATELET - Abnormal; Notable for the following:       Result Value   MCV 103.1 (*)    MCH 34.8 (*)    Neutro Abs 8.2 (*)    All other components within normal limits  ETHANOL - Abnormal; Notable for the following:    Alcohol, Ethyl (B) 62 (*)    All other components within normal limits  ACETAMINOPHEN LEVEL - Abnormal; Notable for the following:    Acetaminophen (Tylenol), Serum <10 (*)    All other components within normal limits  URINALYSIS, ROUTINE W REFLEX MICROSCOPIC - Abnormal; Notable for the following:    Color, Urine STRAW (*)    Squamous Epithelial / LPF 0-5 (*)    All other components within normal limits  RAPID URINE DRUG SCREEN, HOSP PERFORMED - Abnormal; Notable for the following:    Benzodiazepines POSITIVE (*)    Tetrahydrocannabinol POSITIVE (*)    All other components within normal limits  CK - Abnormal; Notable for the following:    Total CK 359 (*)    All other components within normal limits  I-STAT CHEM 8, ED - Abnormal; Notable for the following:    Calcium, Ion 1.09 (*)    Hemoglobin 15.3 (*)    All other components within normal limits  MRSA PCR SCREENING  COMPREHENSIVE METABOLIC PANEL  SALICYLATE LEVEL  PREGNANCY, URINE    EKG  EKG Interpretation  Date/Time:  Friday Jul 13 2016  09:17:56 EDT Ventricular Rate:  105 PR Interval:    QRS Duration: 99 QT Interval:  354 QTC Calculation: 468 R Axis:   55 Text Interpretation:  Sinus tachycardia Consider left atrial enlargement Confirmed by Rubin PayorPICKERING  MD, Harrold DonathNATHAN [phone]([phone]) on 07/13/2016 9:36:13 AM       Radiology No results found.  Procedures Procedures (including critical care time)  Medications Ordered in ED Medications - No data to display   Initial Impression / Assessment and Plan / ED Course  I have reviewed the triage vital signs and the nursing notes.  Pertinent labs & imaging results that were available during my care of the patient were reviewed by me and considered in  my medical decision making (see chart for details).     Patient presented with altered mental status. Had had some reported vomiting. Seen recently for overdose. Labs reassuring. Mental status improved somewhat. Will admit to internal medicine.  Final Clinical Impressions(s) / ED Diagnoses   Final diagnoses:  Encephalopathy    New Prescriptions Discharge Medication List as of 07/13/2016  1:44 PM       Benjiman Core, MD 07/16/16 2323

## 2017-07-08 IMAGING — MR MR HEAD WO/W CM
8 of 12 series · 31 of 48 positions shown · IV contrast (multihance)
Comparison: 04/25/2015 and 01/14/2015 head CT.

CLINICAL DATA: 29-year-old female with history of alcohol abuse and
depression post suicide attempt. Seizure. Subsequent encounter.

EXAM:
MRI HEAD WITHOUT AND WITH CONTRAST
TECHNIQUE: Multiplanar, multiecho pulse sequences of the brain and surrounding
structures were obtained without and with intravenous contrast.
CONTRAST:  12mL MULTIHANCE GADOBENATE DIMEGLUMINE 529 MG/ML IV SOLN

[Series 4: DWI · axial · 4.0mm · 0.94mm/px · z∈[-73,+97]mm · 5 of 44 slices shown (1 of 2)]
[im 1/44]
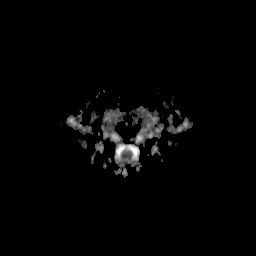
[im 11/44]
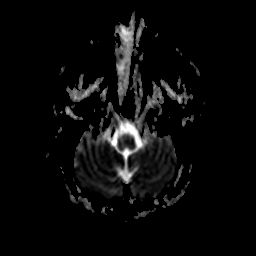
[im 22/44]
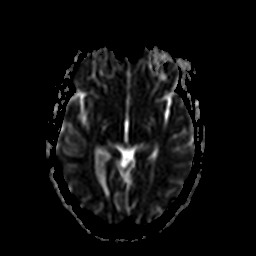
[im 33/44]
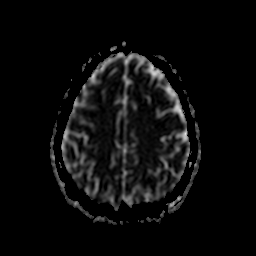
[im 44/44]
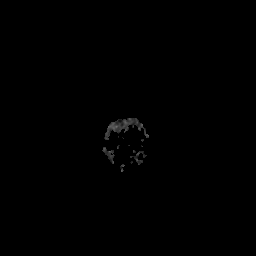

[Series 5: DWI · axial · 4.0mm · 0.94mm/px · z∈[-73,+97]mm · 5 of 44 slices shown (2 of 2)]
[im 1/44]
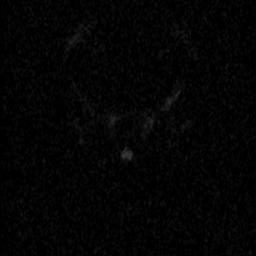
[im 11/44]
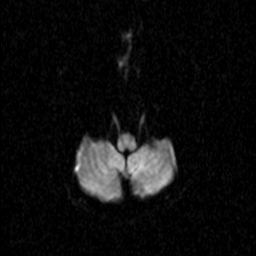
[im 22/44]
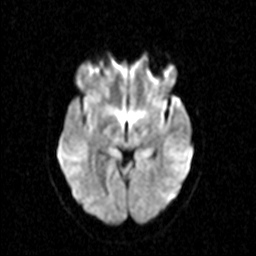
[im 33/44]
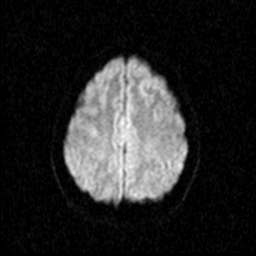
[im 44/44]
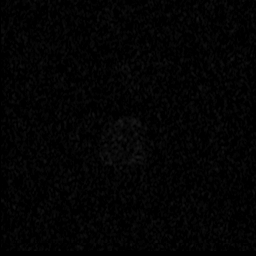

[Series 6: T2 · axial · 5.0mm · 0.45mm/px · z∈[-69,+99]mm · 3 of 27 slices shown (1 of 2)]
[im 1/27]
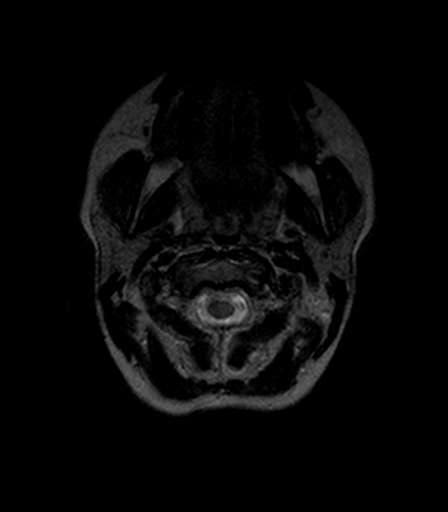
[im 14/27]
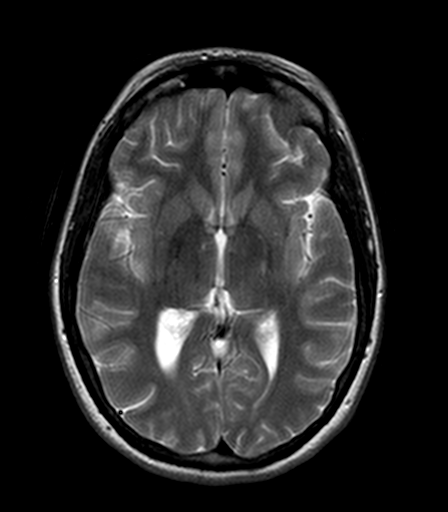
[im 27/27]
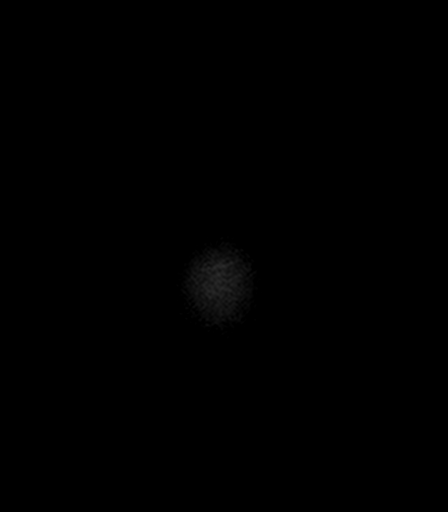

[Series 7: FLAIR · axial · 5.0mm · 0.90mm/px · z∈[-69,+98]mm · 3 of 27 slices shown]
[im 1/27]
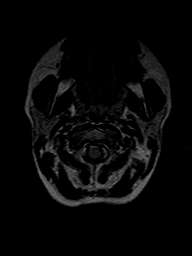
[im 14/27]
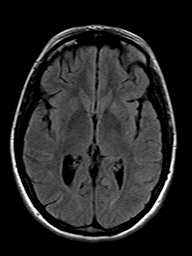
[im 27/27]
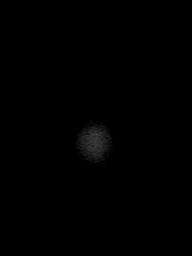

[Series 8: T2 · axial · 5.0mm · 0.45mm/px · z∈[-69,+15]mm · 2 of 27 slices shown (2 of 2)]
[im 1/27]
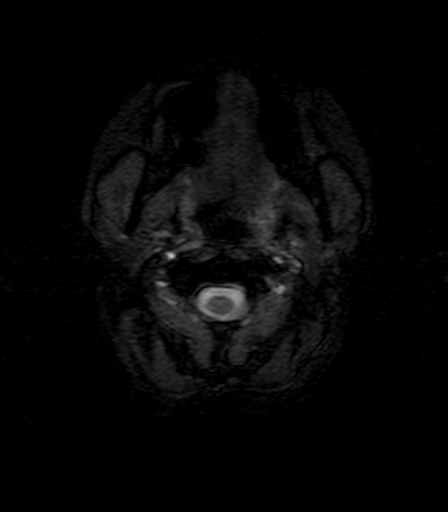
[im 14/27]
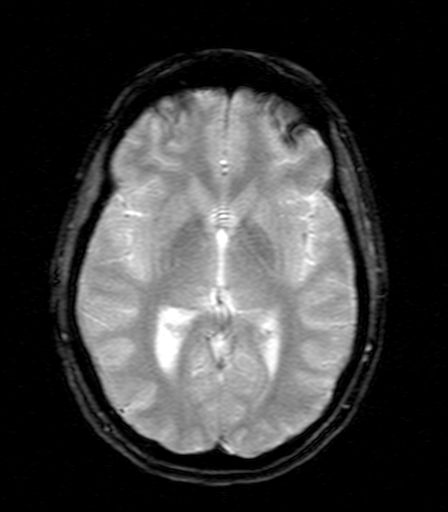

[Series 12: T1 post-contrast · axial · 3.0mm · 0.45mm/px · z∈[-73,+102]mm · 7 of 60 slices shown (1 of 3)]
[im 1/60]
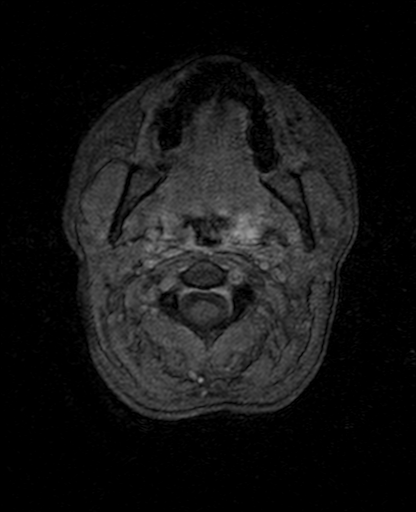
[im 10/60]
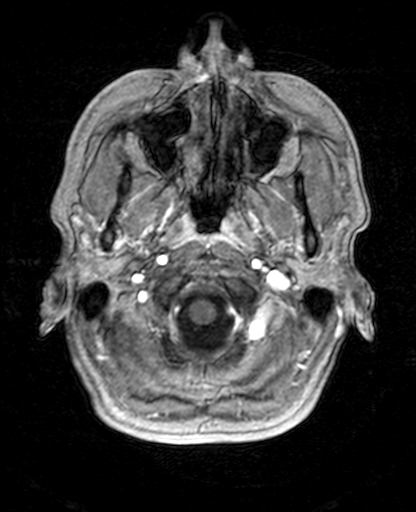
[im 20/60]
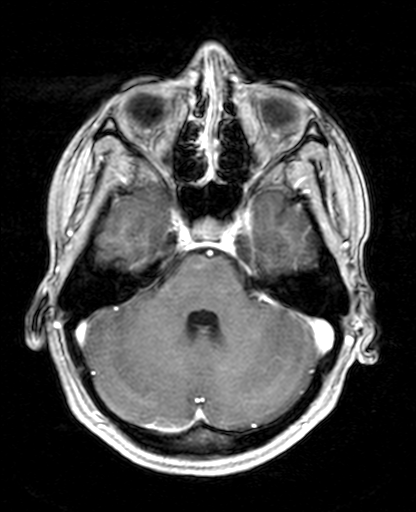
[im 30/60]
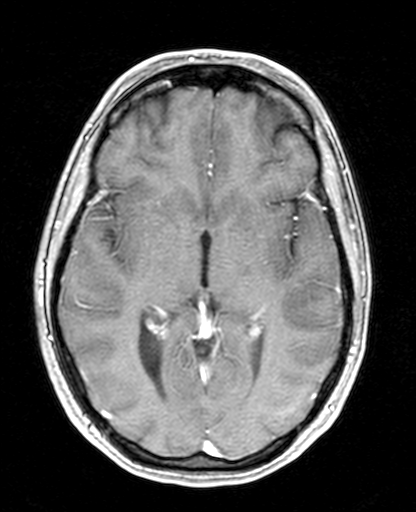
[im 40/60]
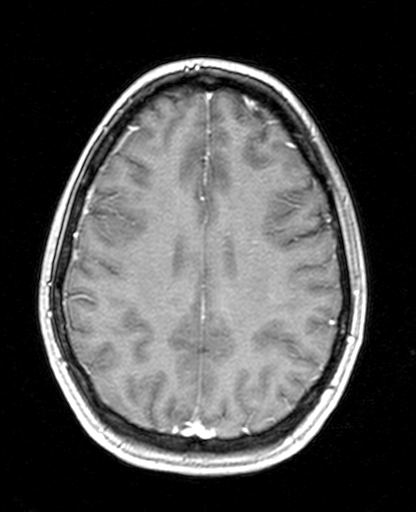
[im 50/60]
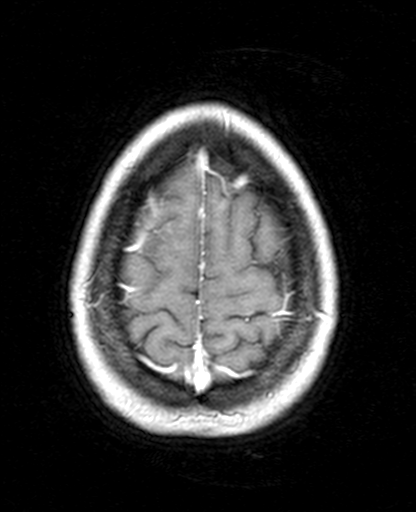
[im 60/60]
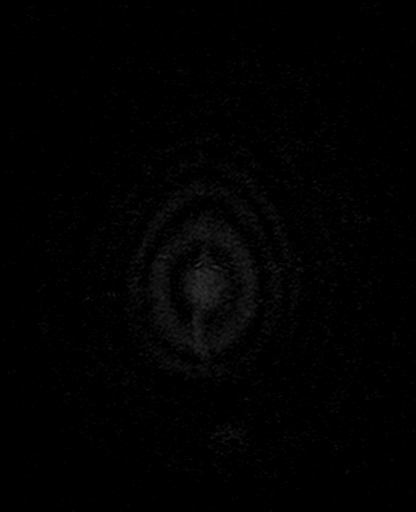

[Series 13: T1 post-contrast · coronal · 5.0mm · 0.45mm/px · 3 of 31 slices shown (2 of 3)]
[im 1/31]
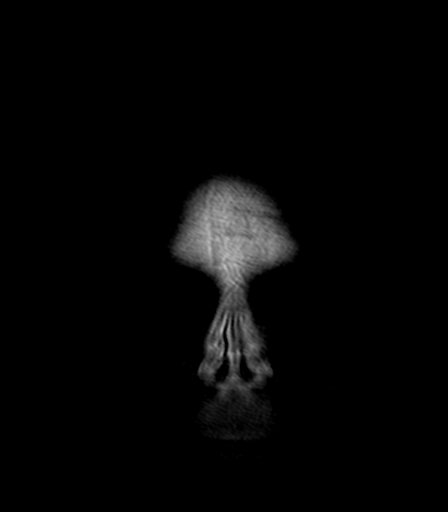
[im 16/31]
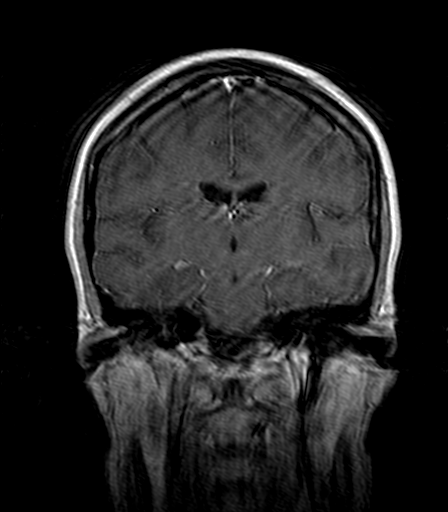
[im 31/31]
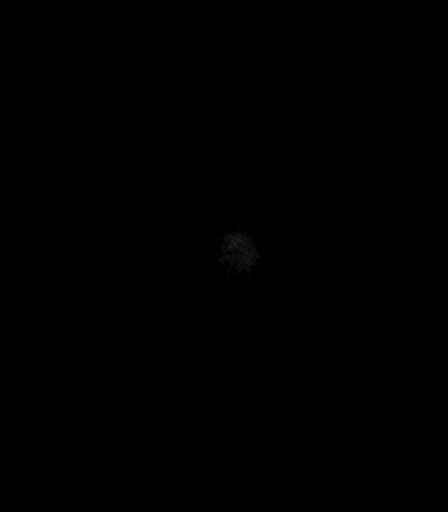

[Series 14: T1 post-contrast · sagittal · 5.0mm · 0.45mm/px · 3 of 29 slices shown (3 of 3)]
[im 1/29]
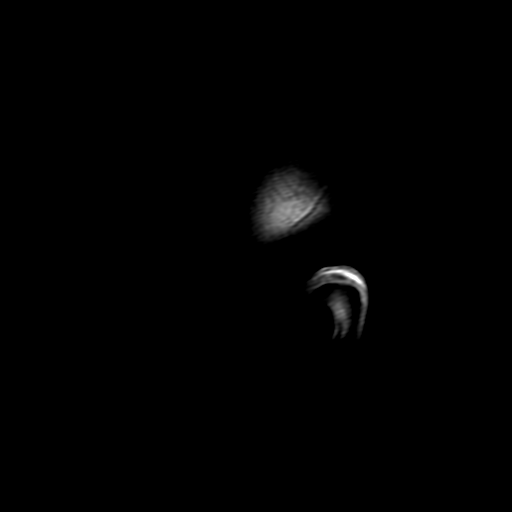
[im 15/29]
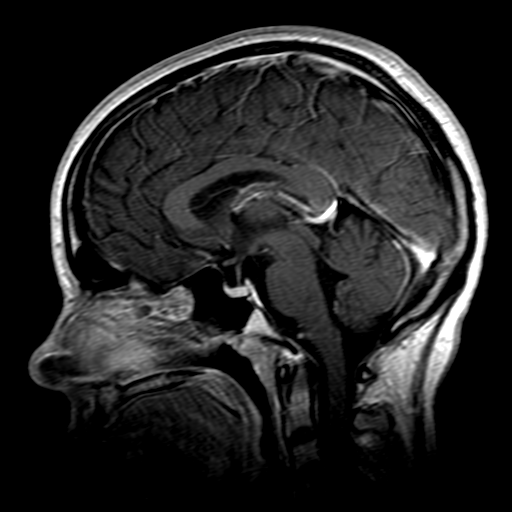
[im 29/29]
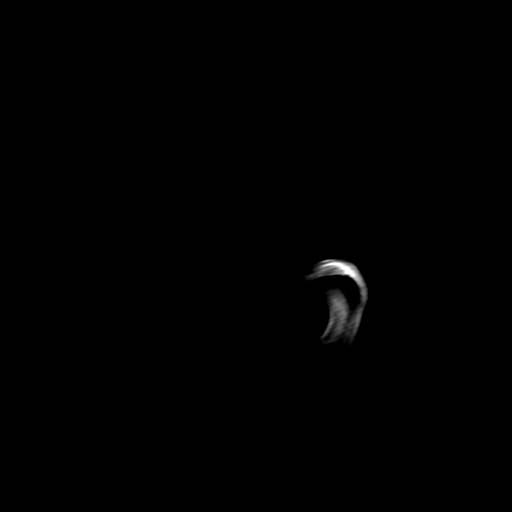

[31 of 48 positions shown; findings below may reference images not displayed]

FINDINGS: Exam is motion degraded.

No acute infarct. On diffusion sequence, slight increased signal
within the mid to posterior temporal lobes bilaterally. I suspect
this is related to artifact arising from the skullbase rather than
result of seizure activity or infection.

No evidence of mesial temporal sclerosis.

No intracranial hemorrhage.

No MR evidence of Wernicke's encephalopathy.

No intracranial mass or abnormal enhancement.

Major intracranial vascular structures are patent.

Cervical medullary junction, pituitary region, pineal region and
orbital structures unremarkable.

Minimal mucosal thickening ethmoid sinus air cells with minimal
polypoid opacification sphenoid sinus air cells.
IMPRESSION: Exam is motion degraded.

No acute infarct or intracranial hemorrhage.

No evidence of mesial temporal sclerosis or Wernicke's
encephalopathy.

No intracranial mass or abnormal enhancement.

Minimal mucosal thickening ethmoid sinus air cells with minimal
polypoid opacification sphenoid sinus air cells.

## 2018-06-17 ENCOUNTER — Telehealth: Payer: Self-pay | Admitting: Nurse Practitioner

## 2018-06-17 NOTE — Telephone Encounter (Signed)
Called patient to schedule a follow up appointment with [facility]. Could not leave a vm for patient.
